# Patient Record
Sex: Female | Born: 1965 | Race: White | Hispanic: No | Marital: Married | Smoking: Former smoker
Health system: Southern US, Community
[De-identification: ages and names within clinical notes are randomized; demographics above are authoritative.]

## PROBLEM LIST (undated history)

## (undated) DIAGNOSIS — Z87442 Personal history of urinary calculi: Secondary | ICD-10-CM

## (undated) DIAGNOSIS — M1990 Unspecified osteoarthritis, unspecified site: Secondary | ICD-10-CM

## (undated) DIAGNOSIS — F419 Anxiety disorder, unspecified: Secondary | ICD-10-CM

## (undated) HISTORY — PX: MASTOPEXY: SHX5358

## (undated) HISTORY — PX: WISDOM TOOTH EXTRACTION: SHX21

## (undated) HISTORY — PX: ABDOMINOPLASTY: SHX5355

---

## 2007-12-11 ENCOUNTER — Encounter: Admission: RE | Admit: 2007-12-11 | Discharge: 2007-12-11 | Payer: Self-pay | Admitting: Obstetrics and Gynecology

## 2007-12-23 ENCOUNTER — Encounter (INDEPENDENT_AMBULATORY_CARE_PROVIDER_SITE_OTHER): Payer: Self-pay | Admitting: Diagnostic Radiology

## 2007-12-23 ENCOUNTER — Encounter: Admission: RE | Admit: 2007-12-23 | Discharge: 2007-12-23 | Payer: Self-pay | Admitting: Family Medicine

## 2009-09-14 ENCOUNTER — Encounter: Admission: RE | Admit: 2009-09-14 | Discharge: 2009-09-14 | Payer: Self-pay | Admitting: Obstetrics and Gynecology

## 2009-12-21 IMAGING — MG MM DIAGNOSTIC UNILATERAL L
3 series · 3 of 3 positions shown · non-contrast
Comparison: 12/04/2007

CLINICAL DATA: Left breast microcalcifications on screening study

DIGITAL DIAGNOSTIC  LEFT MAMMOGRAM ]

[L CC]
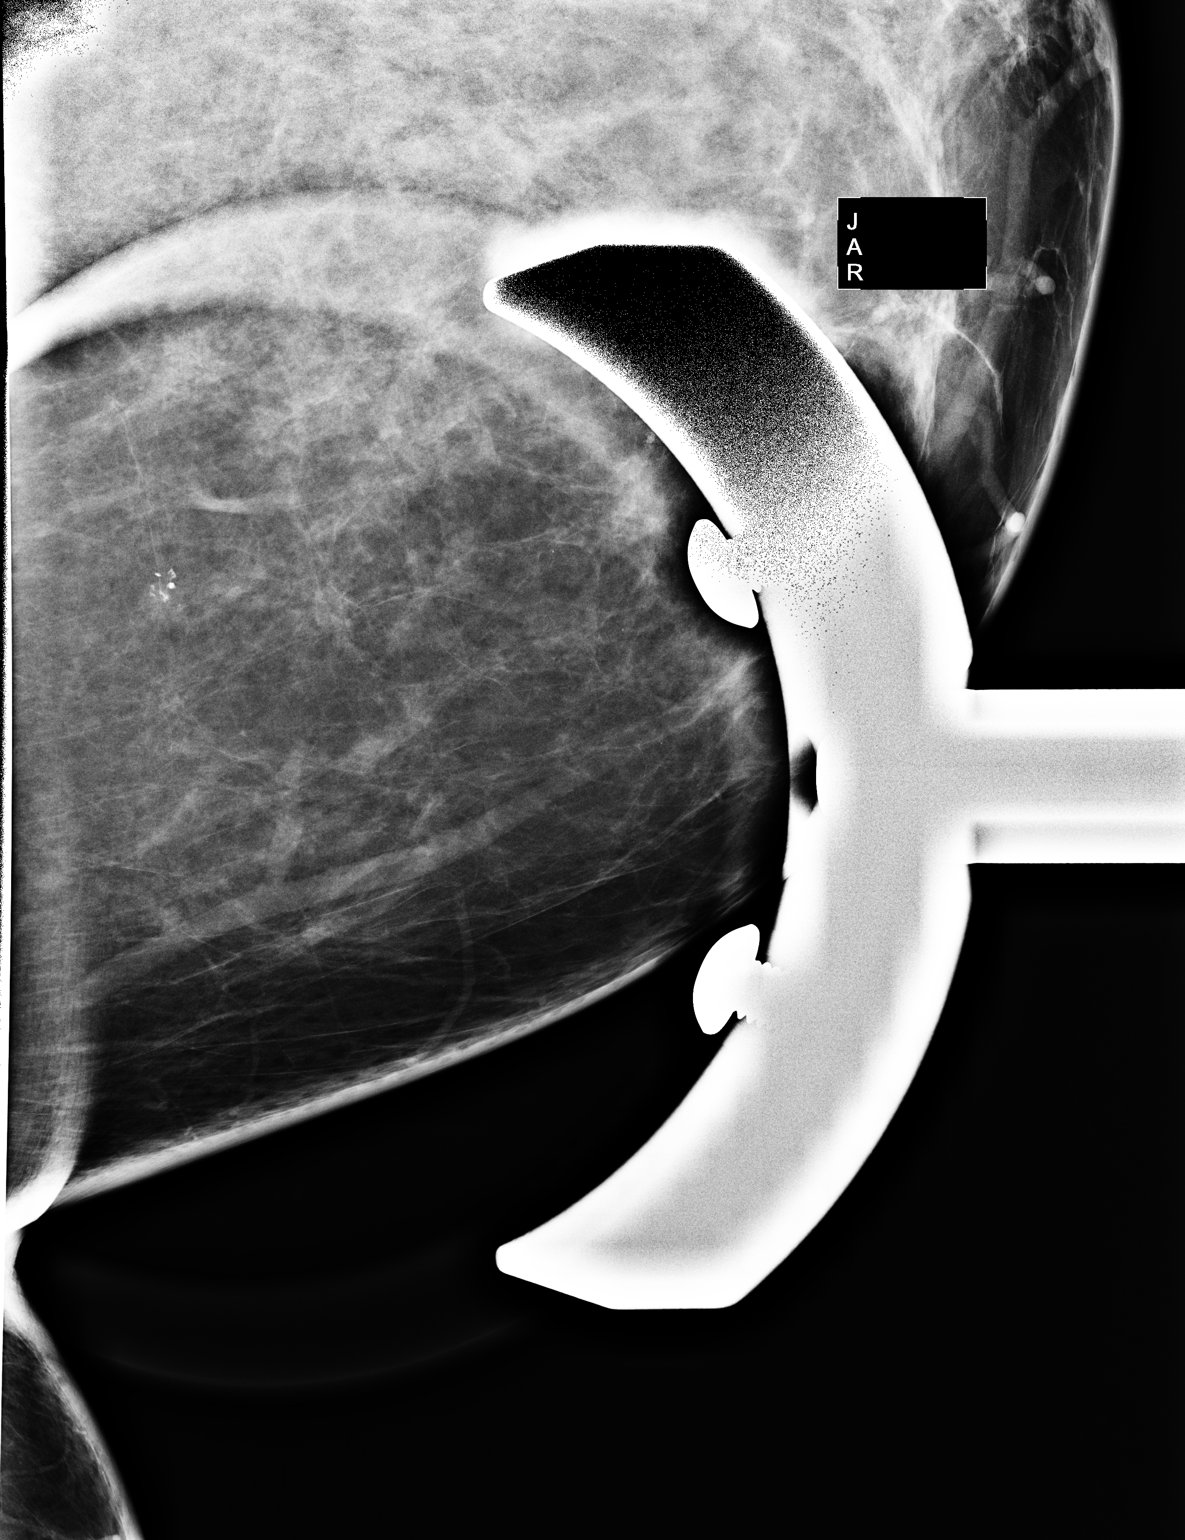

[L ML (1 of 2)]
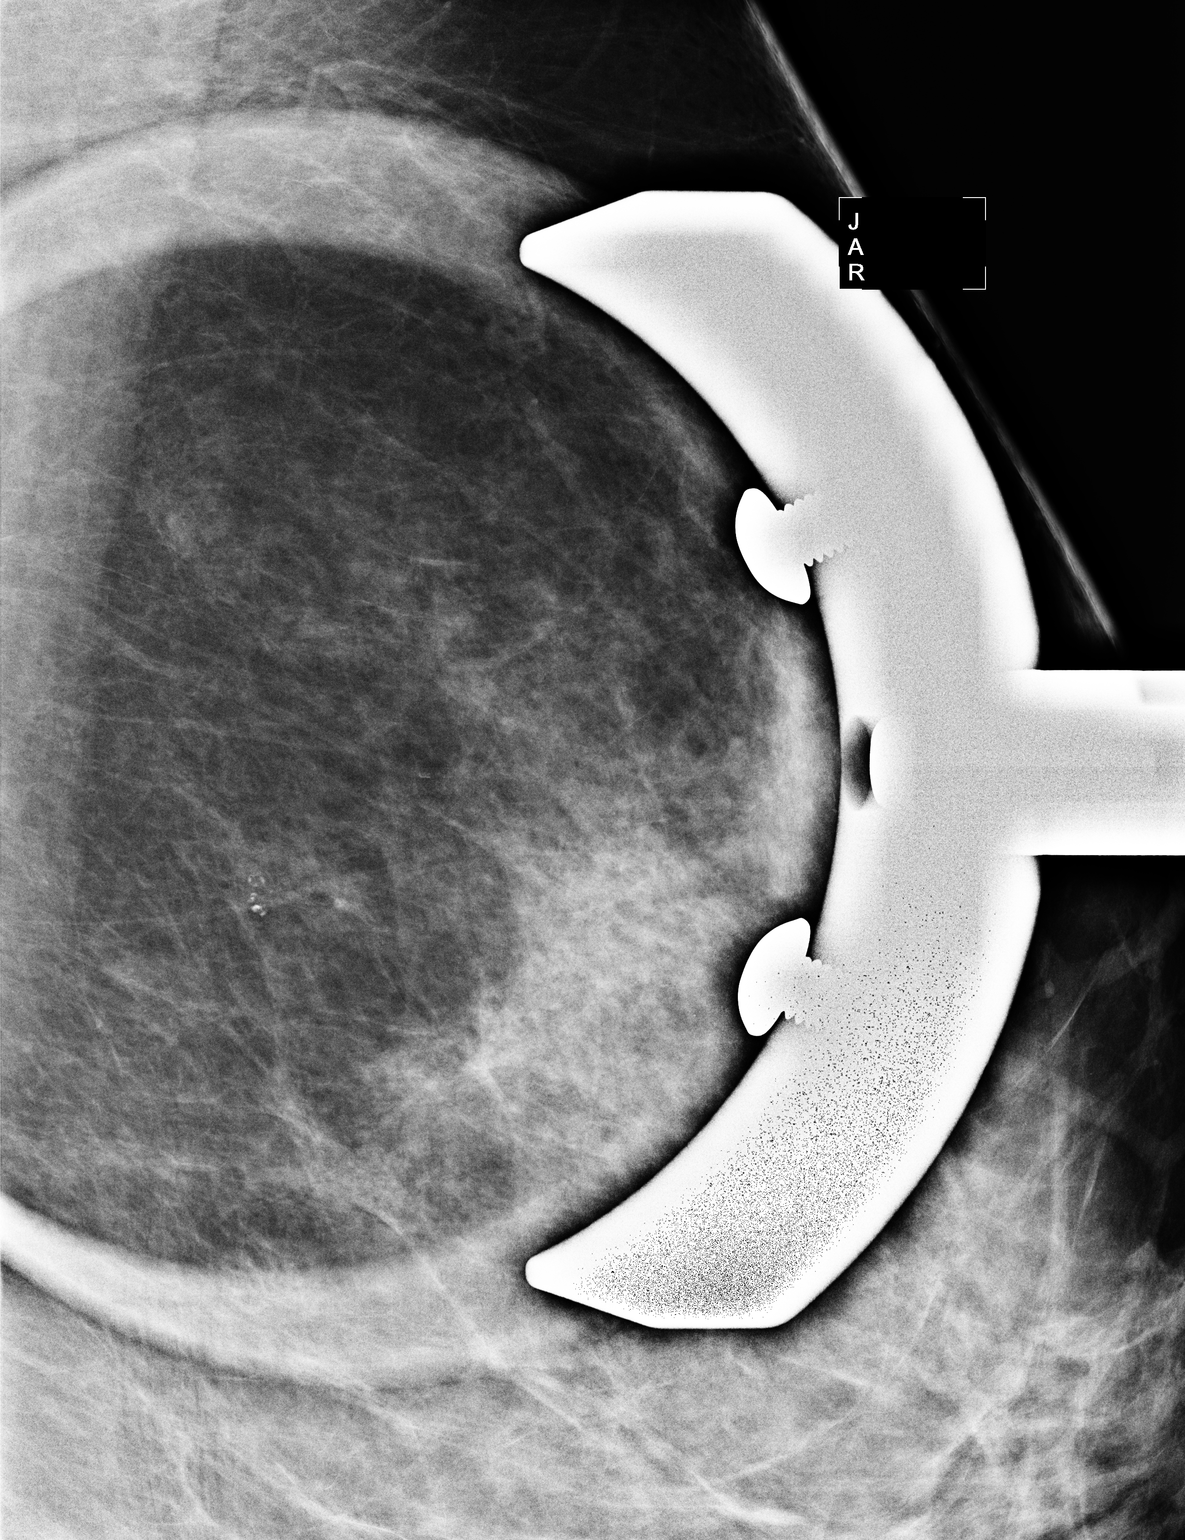

[L ML (2 of 2)]
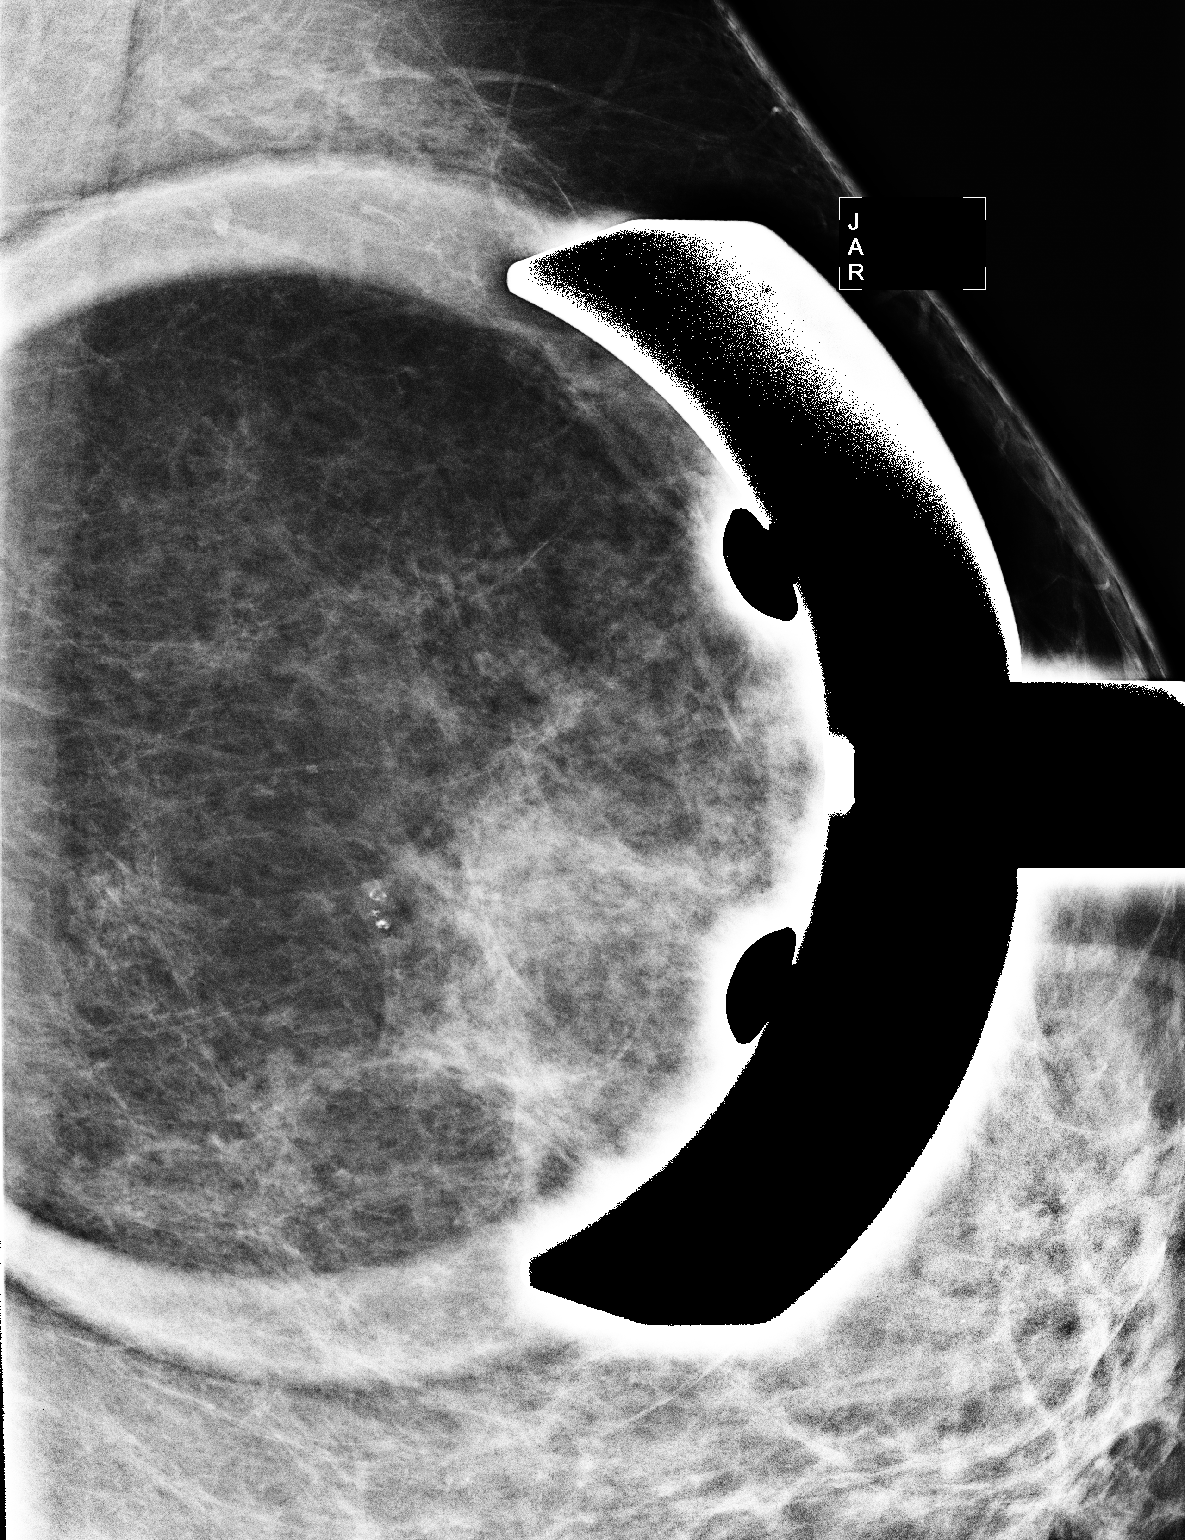

[3 of 3 positions shown; findings below may reference images not displayed]

FINDINGS: Magnification views show clustered microcalcifications
in the posterior inner upper quadrant.  These appear to be
dystrophic.
IMPRESSION: Probably dystrophic microcalcifications.

BI-RADS CATEGORY 3:  Probably benign finding(s) - short interval
follow-up suggested.

Recommendation:

Diagnostic left mammogram with magnification views in 6 months

## 2010-04-23 ENCOUNTER — Encounter: Payer: Self-pay | Admitting: Obstetrics and Gynecology

## 2011-01-26 ENCOUNTER — Other Ambulatory Visit: Payer: Self-pay | Admitting: Obstetrics and Gynecology

## 2011-01-26 DIAGNOSIS — R928 Other abnormal and inconclusive findings on diagnostic imaging of breast: Secondary | ICD-10-CM

## 2011-01-30 ENCOUNTER — Other Ambulatory Visit: Payer: Self-pay | Admitting: Obstetrics and Gynecology

## 2011-01-30 DIAGNOSIS — R928 Other abnormal and inconclusive findings on diagnostic imaging of breast: Secondary | ICD-10-CM

## 2011-02-15 ENCOUNTER — Ambulatory Visit
Admission: RE | Admit: 2011-02-15 | Discharge: 2011-02-15 | Disposition: A | Discharge status: Home / Self Care | Payer: Commercial Indemnity | Source: Ambulatory Visit | Attending: Obstetrics and Gynecology | Admitting: Obstetrics and Gynecology

## 2011-02-15 DIAGNOSIS — R928 Other abnormal and inconclusive findings on diagnostic imaging of breast: Secondary | ICD-10-CM

## 2011-07-17 ENCOUNTER — Other Ambulatory Visit: Payer: Self-pay | Admitting: Obstetrics and Gynecology

## 2011-07-17 DIAGNOSIS — N63 Unspecified lump in unspecified breast: Secondary | ICD-10-CM

## 2011-08-23 ENCOUNTER — Other Ambulatory Visit: Payer: Commercial Indemnity

## 2011-09-18 ENCOUNTER — Ambulatory Visit
Admission: RE | Admit: 2011-09-18 | Discharge: 2011-09-18 | Disposition: A | Discharge status: Home / Self Care | Payer: Commercial Indemnity | Source: Ambulatory Visit | Attending: Obstetrics and Gynecology | Admitting: Obstetrics and Gynecology

## 2011-09-18 DIAGNOSIS — N63 Unspecified lump in unspecified breast: Secondary | ICD-10-CM

## 2012-01-01 ENCOUNTER — Other Ambulatory Visit: Payer: Self-pay | Admitting: Obstetrics and Gynecology

## 2012-01-01 DIAGNOSIS — Z1231 Encounter for screening mammogram for malignant neoplasm of breast: Secondary | ICD-10-CM

## 2012-01-01 DIAGNOSIS — Z803 Family history of malignant neoplasm of breast: Secondary | ICD-10-CM

## 2012-01-29 ENCOUNTER — Ambulatory Visit: Payer: Commercial Indemnity

## 2012-02-22 ENCOUNTER — Other Ambulatory Visit: Payer: Self-pay | Admitting: Obstetrics and Gynecology

## 2012-02-22 ENCOUNTER — Ambulatory Visit
Admission: RE | Admit: 2012-02-22 | Discharge: 2012-02-22 | Disposition: A | Discharge status: Home / Self Care | Payer: Commercial Indemnity | Source: Ambulatory Visit | Attending: Obstetrics and Gynecology | Admitting: Obstetrics and Gynecology

## 2012-02-22 DIAGNOSIS — Z803 Family history of malignant neoplasm of breast: Secondary | ICD-10-CM

## 2012-02-22 DIAGNOSIS — Z1231 Encounter for screening mammogram for malignant neoplasm of breast: Secondary | ICD-10-CM

## 2013-12-25 ENCOUNTER — Other Ambulatory Visit: Payer: Self-pay | Admitting: Obstetrics and Gynecology

## 2013-12-28 LAB — CYTOLOGY - PAP

## 2013-12-29 ENCOUNTER — Ambulatory Visit (INDEPENDENT_AMBULATORY_CARE_PROVIDER_SITE_OTHER): Payer: Managed Care, Other (non HMO) | Admitting: Nurse Practitioner

## 2013-12-29 ENCOUNTER — Encounter: Payer: Self-pay | Admitting: Nurse Practitioner

## 2013-12-29 VITALS — BP 110/74 | HR 64 | Ht 63.0 in | Wt 149.0 lb

## 2013-12-29 DIAGNOSIS — R3915 Urgency of urination: Secondary | ICD-10-CM

## 2013-12-29 DIAGNOSIS — N951 Menopausal and female climacteric states: Secondary | ICD-10-CM

## 2013-12-29 LAB — POCT URINALYSIS DIPSTICK
BILIRUBIN UA: NEGATIVE
Glucose, UA: NEGATIVE
KETONES UA: NEGATIVE
LEUKOCYTES UA: NEGATIVE
NITRITE UA: NEGATIVE
Protein, UA: NEGATIVE
RBC UA: NEGATIVE
UROBILINOGEN UA: NEGATIVE

## 2013-12-29 NOTE — Progress Notes (Signed)
Patient ID: Bianca Sanders, female   DOB: [birth year], 48 y.o.   MRN: [id number] 48 y.o. G2P0 Married Caucasian Fe here for [facility] consult.  She is perimenopausal and now having vaso symptoms.  She is having hot flashes, poor sleep with insomnia, 'brain fog' and emotional symptoms.  She went last Friday and saw GYN Dr. Matthew Saras.  She states with the Paraguard IUD her menses have been somewhat  irregular this past year.  Cycles last 4 days with increase in PMS and headaches.  A friend at work gave her a few hormones - she had 1/2 tablet of Estradiol 0.5 mg and OTC Prometrium cream X 2 weeks.  Her vaso symptoms improved.  When she saw Dr. Matthew Saras he gave her Divigel and Prometrium which she has started.  She comes in now for another second opinion about HRT.   She did have some UTI symptoms yesterday with urgency - that has now resolved.  She wants to check a urine specimen.  Patient's last menstrual period was 12/15/2013.          Sexually active: Yes.    The current method of family planning is IUD. Copper 2 years   Exercising: No.  The patient does not participate in regular exercise at present. Smoker:  Occasionally when anxious  Health Maintenance: Pap:  12/25/13, at [facility] MMG:  12/25/13, 3D at [facility] Colonoscopy:  never TDaP:  30+ years ago Labs: at [facility] 12/25/13   reports that she has been smoking.  She has never used smokeless tobacco. She reports that she drinks about 2 ounces of alcohol per week. She reports that she does not use illicit drugs.  History reviewed. No pertinent past medical history.  Past Surgical History  Procedure Laterality Date  . Wisdom tooth extraction    . Mastopexy    . Abdominoplasty      Current Outpatient Prescriptions  Medication Sig Dispense Refill  . Estradiol (DIVIGEL) 1 MG/GM GEL Place onto the skin every morning.      . progesterone (PROMETRIUM) 100 MG capsule Take 1 capsule by mouth daily.      Marland Kitchen  zolpidem (AMBIEN) 5 MG tablet Take 1 tablet by mouth at bedtime.       No current facility-administered medications for this visit.    Family History  Problem Relation Age of Onset  . Hypertension Mother   . Thyroid disease Mother     hypothyroid  . Atrial fibrillation Mother   . Heart failure Father     CHF  . Diabetes Father   . Cancer Father     bone  . Breast cancer Sister 79    estrogen fed  . Hypertension Brother   . Thyroid disease Maternal Grandfather   . Diabetes Paternal Grandfather   . Hypertension Brother     ROS:  Pertinent items are noted in HPI.  Otherwise, a comprehensive ROS was negative.  Exam:   BP 110/74  Pulse 64  Ht _0  (1.6 m)  Wt 149 lb (67.586 kg)  BMI 26.40 kg/m2  LMP 12/15/2013 Height: _1  (160 cm)  Ht Readings from Last 3 Encounters:  12/29/13 _2  (1.6 m)    General appearance: alert, cooperative and appears stated age No exam done   Pelvic:not done  A:  Perimenopausal with vaso symptoms  Sees GYN - Dr. Matthew Saras and on HRT  Sister with history of breast cancer at age 84  Urinary urgency that is resolved  P:   Discussed WHI study with +/- benefits and risk of HRT  Questions were answered and she will continue to follow with Dr. Matthew Saras  Discussion about sister with breast cancer and BRCA testing  Urine chemstrip today was normal   Consult visit with patient and evaluation of urine was 42 minutes, 35 minutes face to face

## 2013-12-29 NOTE — Patient Instructions (Signed)
Check with sister about BRCA testing.  [place] for genetic testing

## 2013-12-29 NOTE — Progress Notes (Signed)
Encounter reviewed by Dr. Conley SimmondsBrook Silva. Patient is smoker and not candidate for combined oral contraceptives.

## 2014-01-08 ENCOUNTER — Telehealth: Payer: Self-pay | Admitting: *Deleted

## 2014-01-08 NOTE — Telephone Encounter (Signed)
Pt left a message requesting a genetic appt.  Called pt back and left her a message for call me so I can schedule it w/ her.

## 2014-01-11 ENCOUNTER — Telehealth: Payer: Self-pay | Admitting: *Deleted

## 2014-01-11 NOTE — Telephone Encounter (Signed)
Called and left a message for the pt to return my call so I can schedule a genetic appt.  

## 2014-01-21 ENCOUNTER — Telehealth: Payer: Self-pay | Admitting: *Deleted

## 2014-01-21 NOTE — Telephone Encounter (Signed)
Called and left a message for the pt to return my call so I can schedule a genetic appt.  

## 2014-01-27 ENCOUNTER — Telehealth: Payer: Self-pay | Admitting: Genetic Counselor

## 2014-01-27 NOTE — Telephone Encounter (Signed)
S/W PATIENT AND GAVE GENETIC APPT FOR 12/14 @ 2 Bianca ClontsW/KAREN Lowell GuitarPOWELL

## 2014-02-01 ENCOUNTER — Encounter: Payer: Self-pay | Admitting: Nurse Practitioner

## 2014-03-15 ENCOUNTER — Other Ambulatory Visit: Payer: Commercial Indemnity

## 2014-03-15 ENCOUNTER — Encounter: Payer: Self-pay | Admitting: Genetic Counselor

## 2014-03-15 ENCOUNTER — Ambulatory Visit (HOSPITAL_BASED_OUTPATIENT_CLINIC_OR_DEPARTMENT_OTHER): Payer: Commercial Indemnity | Admitting: Genetic Counselor

## 2014-03-15 DIAGNOSIS — Z803 Family history of malignant neoplasm of breast: Secondary | ICD-10-CM

## 2014-03-15 DIAGNOSIS — Z315 Encounter for genetic counseling: Secondary | ICD-10-CM

## 2014-03-15 NOTE — Progress Notes (Signed)
REFERRING PROVIDER: Milford Cage, NP  PRIMARY PROVIDER:  No PCP Per Patient  PRIMARY REASON FOR VISIT:  1. Family history of breast cancer in first degree relative      HISTORY OF PRESENT ILLNESS:   Ms. Arambula, a 48 y.o. female, was seen for a [facility] cancer genetics consultation at the request of Milford Cage, NP due to a family history of cancer.  Ms. Ryback presents to clinic today to discuss the possibility of a hereditary predisposition to cancer, genetic testing, and to further clarify her future cancer risks, as well as potential cancer risks for family members.   CANCER HISTORY:   No history exists.     HISTORY OF PRESENT ILLNESS: Ms. Shaikh is a 48 y.o. female with no personal history of cancer.  Her sister was diagnosed with breast cancer about 10 years ago, at age 44.  Ms. Tift is not aware that her sister has had genetic testing.  HORMONAL RISK FACTORS:  Ovaries intact: yes.  Hysterectomy: no.  Menopausal status: perimenopausal.  HRT use: 1 years. Up to date with pelvic exams:  yes.  History reviewed. No pertinent past medical history.  Past Surgical History  Procedure Laterality Date  . Wisdom tooth extraction    . Mastopexy    . Abdominoplasty      History   Social History  . Marital Status: Married    Spouse Name: N/A    Number of Children: 2  . Years of Education: N/A   Social History Main Topics  . Smoking status: Current Some Day Smoker  . Smokeless tobacco: Never Used  . Alcohol Use: 2.0 oz/week    4 drink(s) per week  . Drug Use: No  . Sexual Activity:    Partners: Male    Birth Control/ Protection: IUD   Other Topics Concern  . None   Social History Narrative     FAMILY HISTORY:  We obtained a detailed, 4-generation family history.  Significant diagnoses are listed below: Family History  Problem Relation Age of Onset  . Hypertension Mother   . Thyroid disease Mother     hypothyroid  .  Atrial fibrillation Mother   . Heart failure Father     CHF  . Diabetes Father   . Cancer Father     bone  . Breast cancer Sister 68    estrogen fed  . Hypertension Brother   . Thyroid disease Maternal Grandfather   . Diabetes Paternal Grandfather   . Hypertension Brother     Patient's maternal ancestors are of [place] descent, and paternal ancestors are of English descent. There is no reported Ashkenazi Jewish ancestry. There is no known consanguinity.  GENETIC COUNSELING ASSESSMENT: Jhane Lorio is a 48 y.o. female with a family history of premenopausal breast cancer which somewhat suggestive of a hereditary cancer syndrome and predisposition to cancer. We, therefore, discussed and recommended the following at today's visit.   DISCUSSION: We reviewed the characteristics, features and inheritance patterns of hereditary cancer syndromes. We also discussed genetic testing, including the appropriate family members to test, the process of testing, insurance coverage and turn-around-time for results. Based on the early onset breast cancer in Ms. Fruchtnight's sister, we discussed BRCA mutations, as well as other hereditary genes associated with breast cancer.  We discussed the implications of a negative, positive and/or variant of uncertain significant result. Based on Ms. Crites's family history she qualifies for genetic testing, however, her sister is the best person in the family to  test.  There is not a strong family history of cancer, despite having an individual with premenopausal breast cancer.   In order to estimate her chance of having a BRCA mutation, we used statistical models (Tyrer Cusik, Myriad risk calculator and [employer] II) and laboratory data that take into account her personal medical history, family history and ancestry.  Because each model is different, there can be a lot of variability in the risks they give.  Therefore, these numbers must be considered a rough range and  not a precise risk of having a BRCA mutation.  These models estimate that she has approximately a 0.77-6% chance of having a mutation. Based on the assessment of her family and personal history, genetic testing is recommended.  Based on the patient's personal and family history, statistical models (Tyrer Cusik)  and literature data were used to estimate her risk of developing breast cancer. These estimate her lifetime risk of developing breast cancer to be approximately 17.8%. This estimation does not take into account any genetic testing results.  The patient's lifetime breast cancer risk is a preliminary estimate based on available information using one of several models endorsed by the [place] (ACS). The ACS recommends consideration of breast MRI screening as an adjunct to mammography for patients at high risk (defined as 20% or greater lifetime risk). A more detailed breast cancer risk assessment can be considered, if clinically indicated.   PLAN: Based on Ms. Huneke's family history, we recommended her sister, Zigmund Daniel, who was diagnosed with breast cancer at age 24, have genetic counseling and testing. Ms. Willetts will let us know if we can be of any assistance in coordinating genetic counseling and/or testing for this family member.   Lastly, we encouraged Ms. Bougher to remain in contact with cancer genetics annually so that we can continuously update the family history and inform her of any changes in cancer genetics and testing that may be of benefit for this family.   Ms.  Favela's questions were answered to her satisfaction today. Our contact information was provided should additional questions or concerns arise. Thank you for the referral and allowing [place] to share in the care of your patient.   Tyreese Thain P. Florene Glen, [place], [facility] Certified Genetic Counselor [email or web address] phone: [phone]  The patient was seen for a total of 45 minutes in face-to-face  genetic counseling.  This patient was discussed with Drs. Magrinat, Lindi Adie and/or Burr Medico who agrees with the above.    _______________________________________________________________________ For Office Staff:  Number of people involved in session: 1 Was an Intern/ student involved with case: no

## 2014-06-01 ENCOUNTER — Encounter: Payer: Self-pay | Admitting: *Deleted

## 2014-06-02 ENCOUNTER — Ambulatory Visit (INDEPENDENT_AMBULATORY_CARE_PROVIDER_SITE_OTHER): Payer: Managed Care, Other (non HMO) | Admitting: *Deleted

## 2014-06-02 DIAGNOSIS — I8393 Asymptomatic varicose veins of bilateral lower extremities: Secondary | ICD-10-CM

## 2014-06-02 NOTE — Progress Notes (Signed)
X=.3% Sotradecol administered with a 27g butterfly.  Patient received a total of 5cc.  Cutaneous Laser:pulsed mode  810j/cm2 400 ms delay  13 ms Duration 0.5 spot  Total pulses: 905 Total energy 1.436  Total time::11  Treated all areas of concern with a combo of sclero and CL. Tol well. Anticipate good results and will follow prn.  Photos: Yes.    Compression stockings applied: Yes.

## 2014-06-03 ENCOUNTER — Encounter: Payer: Self-pay | Admitting: Vascular Surgery

## 2015-09-21 DIAGNOSIS — F988 Other specified behavioral and emotional disorders with onset usually occurring in childhood and adolescence: Secondary | ICD-10-CM | POA: Diagnosis not present

## 2015-09-21 DIAGNOSIS — N951 Menopausal and female climacteric states: Secondary | ICD-10-CM | POA: Diagnosis not present

## 2015-09-21 DIAGNOSIS — F419 Anxiety disorder, unspecified: Secondary | ICD-10-CM | POA: Diagnosis not present

## 2015-09-21 DIAGNOSIS — E663 Overweight: Secondary | ICD-10-CM | POA: Diagnosis not present

## 2015-11-15 DIAGNOSIS — L245 Irritant contact dermatitis due to other chemical products: Secondary | ICD-10-CM | POA: Diagnosis not present

## 2015-11-15 DIAGNOSIS — L718 Other rosacea: Secondary | ICD-10-CM | POA: Diagnosis not present

## 2015-12-22 DIAGNOSIS — Z6825 Body mass index (BMI) 25.0-25.9, adult: Secondary | ICD-10-CM | POA: Diagnosis not present

## 2015-12-22 DIAGNOSIS — E7889 Other lipoprotein metabolism disorders: Secondary | ICD-10-CM | POA: Diagnosis not present

## 2015-12-22 DIAGNOSIS — F988 Other specified behavioral and emotional disorders with onset usually occurring in childhood and adolescence: Secondary | ICD-10-CM | POA: Diagnosis not present

## 2015-12-22 DIAGNOSIS — F419 Anxiety disorder, unspecified: Secondary | ICD-10-CM | POA: Diagnosis not present

## 2016-03-15 DIAGNOSIS — Z6825 Body mass index (BMI) 25.0-25.9, adult: Secondary | ICD-10-CM | POA: Diagnosis not present

## 2016-03-15 DIAGNOSIS — Z13228 Encounter for screening for other metabolic disorders: Secondary | ICD-10-CM | POA: Diagnosis not present

## 2016-03-15 DIAGNOSIS — Z13220 Encounter for screening for lipoid disorders: Secondary | ICD-10-CM | POA: Diagnosis not present

## 2016-03-15 DIAGNOSIS — Z01419 Encounter for gynecological examination (general) (routine) without abnormal findings: Secondary | ICD-10-CM | POA: Diagnosis not present

## 2016-04-03 DIAGNOSIS — Z1231 Encounter for screening mammogram for malignant neoplasm of breast: Secondary | ICD-10-CM | POA: Diagnosis not present

## 2016-04-12 DIAGNOSIS — J329 Chronic sinusitis, unspecified: Secondary | ICD-10-CM | POA: Diagnosis not present

## 2016-04-12 DIAGNOSIS — E663 Overweight: Secondary | ICD-10-CM | POA: Diagnosis not present

## 2016-04-12 DIAGNOSIS — Z6826 Body mass index (BMI) 26.0-26.9, adult: Secondary | ICD-10-CM | POA: Diagnosis not present

## 2016-05-01 DIAGNOSIS — E663 Overweight: Secondary | ICD-10-CM | POA: Diagnosis not present

## 2016-05-01 DIAGNOSIS — Z716 Tobacco abuse counseling: Secondary | ICD-10-CM | POA: Diagnosis not present

## 2016-05-01 DIAGNOSIS — F988 Other specified behavioral and emotional disorders with onset usually occurring in childhood and adolescence: Secondary | ICD-10-CM | POA: Diagnosis not present

## 2016-05-01 DIAGNOSIS — Z6826 Body mass index (BMI) 26.0-26.9, adult: Secondary | ICD-10-CM | POA: Diagnosis not present

## 2016-10-29 DIAGNOSIS — F17200 Nicotine dependence, unspecified, uncomplicated: Secondary | ICD-10-CM | POA: Diagnosis not present

## 2016-10-29 DIAGNOSIS — Z6827 Body mass index (BMI) 27.0-27.9, adult: Secondary | ICD-10-CM | POA: Diagnosis not present

## 2016-10-29 DIAGNOSIS — F988 Other specified behavioral and emotional disorders with onset usually occurring in childhood and adolescence: Secondary | ICD-10-CM | POA: Diagnosis not present

## 2016-10-29 DIAGNOSIS — Z1389 Encounter for screening for other disorder: Secondary | ICD-10-CM | POA: Diagnosis not present

## 2016-10-29 DIAGNOSIS — F419 Anxiety disorder, unspecified: Secondary | ICD-10-CM | POA: Diagnosis not present

## 2016-12-11 DIAGNOSIS — F419 Anxiety disorder, unspecified: Secondary | ICD-10-CM | POA: Diagnosis not present

## 2016-12-11 DIAGNOSIS — F17200 Nicotine dependence, unspecified, uncomplicated: Secondary | ICD-10-CM | POA: Diagnosis not present

## 2016-12-11 DIAGNOSIS — Z6827 Body mass index (BMI) 27.0-27.9, adult: Secondary | ICD-10-CM | POA: Diagnosis not present

## 2016-12-11 DIAGNOSIS — F988 Other specified behavioral and emotional disorders with onset usually occurring in childhood and adolescence: Secondary | ICD-10-CM | POA: Diagnosis not present

## 2016-12-27 DIAGNOSIS — L738 Other specified follicular disorders: Secondary | ICD-10-CM | POA: Diagnosis not present

## 2016-12-27 DIAGNOSIS — L820 Inflamed seborrheic keratosis: Secondary | ICD-10-CM | POA: Diagnosis not present

## 2017-02-25 DIAGNOSIS — L819 Disorder of pigmentation, unspecified: Secondary | ICD-10-CM | POA: Diagnosis not present

## 2017-02-25 DIAGNOSIS — L821 Other seborrheic keratosis: Secondary | ICD-10-CM | POA: Diagnosis not present

## 2017-02-25 DIAGNOSIS — D1801 Hemangioma of skin and subcutaneous tissue: Secondary | ICD-10-CM | POA: Diagnosis not present

## 2017-03-15 DIAGNOSIS — F419 Anxiety disorder, unspecified: Secondary | ICD-10-CM | POA: Diagnosis not present

## 2017-03-15 DIAGNOSIS — M25552 Pain in left hip: Secondary | ICD-10-CM | POA: Diagnosis not present

## 2017-03-15 DIAGNOSIS — F17200 Nicotine dependence, unspecified, uncomplicated: Secondary | ICD-10-CM | POA: Diagnosis not present

## 2017-03-15 DIAGNOSIS — F988 Other specified behavioral and emotional disorders with onset usually occurring in childhood and adolescence: Secondary | ICD-10-CM | POA: Diagnosis not present

## 2017-04-10 DIAGNOSIS — Z1231 Encounter for screening mammogram for malignant neoplasm of breast: Secondary | ICD-10-CM | POA: Diagnosis not present

## 2017-04-10 DIAGNOSIS — Z01419 Encounter for gynecological examination (general) (routine) without abnormal findings: Secondary | ICD-10-CM | POA: Diagnosis not present

## 2017-04-10 DIAGNOSIS — Z6825 Body mass index (BMI) 25.0-25.9, adult: Secondary | ICD-10-CM | POA: Diagnosis not present

## 2017-06-24 DIAGNOSIS — F419 Anxiety disorder, unspecified: Secondary | ICD-10-CM | POA: Diagnosis not present

## 2017-06-24 DIAGNOSIS — E7889 Other lipoprotein metabolism disorders: Secondary | ICD-10-CM | POA: Diagnosis not present

## 2017-06-24 DIAGNOSIS — F988 Other specified behavioral and emotional disorders with onset usually occurring in childhood and adolescence: Secondary | ICD-10-CM | POA: Diagnosis not present

## 2017-06-24 DIAGNOSIS — Z1211 Encounter for screening for malignant neoplasm of colon: Secondary | ICD-10-CM | POA: Diagnosis not present

## 2017-09-16 DIAGNOSIS — F988 Other specified behavioral and emotional disorders with onset usually occurring in childhood and adolescence: Secondary | ICD-10-CM | POA: Diagnosis not present

## 2017-09-16 DIAGNOSIS — Z79899 Other long term (current) drug therapy: Secondary | ICD-10-CM | POA: Diagnosis not present

## 2017-09-16 DIAGNOSIS — F419 Anxiety disorder, unspecified: Secondary | ICD-10-CM | POA: Diagnosis not present

## 2017-09-16 DIAGNOSIS — E7889 Other lipoprotein metabolism disorders: Secondary | ICD-10-CM | POA: Diagnosis not present

## 2017-11-01 DIAGNOSIS — M545 Low back pain: Secondary | ICD-10-CM | POA: Diagnosis not present

## 2017-11-01 DIAGNOSIS — M1612 Unilateral primary osteoarthritis, left hip: Secondary | ICD-10-CM | POA: Diagnosis not present

## 2017-11-01 DIAGNOSIS — M25552 Pain in left hip: Secondary | ICD-10-CM | POA: Diagnosis not present

## 2017-11-08 DIAGNOSIS — Z13820 Encounter for screening for osteoporosis: Secondary | ICD-10-CM | POA: Diagnosis not present

## 2017-11-15 DIAGNOSIS — M25552 Pain in left hip: Secondary | ICD-10-CM | POA: Diagnosis not present

## 2017-11-22 DIAGNOSIS — M25552 Pain in left hip: Secondary | ICD-10-CM | POA: Diagnosis not present

## 2017-11-29 DIAGNOSIS — M25552 Pain in left hip: Secondary | ICD-10-CM | POA: Diagnosis not present

## 2017-12-13 DIAGNOSIS — M25552 Pain in left hip: Secondary | ICD-10-CM | POA: Diagnosis not present

## 2017-12-19 DIAGNOSIS — F419 Anxiety disorder, unspecified: Secondary | ICD-10-CM | POA: Diagnosis not present

## 2017-12-19 DIAGNOSIS — Z1331 Encounter for screening for depression: Secondary | ICD-10-CM | POA: Diagnosis not present

## 2017-12-19 DIAGNOSIS — Z6826 Body mass index (BMI) 26.0-26.9, adult: Secondary | ICD-10-CM | POA: Diagnosis not present

## 2017-12-19 DIAGNOSIS — M25552 Pain in left hip: Secondary | ICD-10-CM | POA: Diagnosis not present

## 2017-12-19 DIAGNOSIS — F988 Other specified behavioral and emotional disorders with onset usually occurring in childhood and adolescence: Secondary | ICD-10-CM | POA: Diagnosis not present

## 2017-12-20 DIAGNOSIS — M25552 Pain in left hip: Secondary | ICD-10-CM | POA: Diagnosis not present

## 2018-01-31 DIAGNOSIS — M25552 Pain in left hip: Secondary | ICD-10-CM | POA: Diagnosis not present

## 2018-02-25 DIAGNOSIS — D225 Melanocytic nevi of trunk: Secondary | ICD-10-CM | POA: Diagnosis not present

## 2018-02-25 DIAGNOSIS — L821 Other seborrheic keratosis: Secondary | ICD-10-CM | POA: Diagnosis not present

## 2018-03-28 DIAGNOSIS — Z1211 Encounter for screening for malignant neoplasm of colon: Secondary | ICD-10-CM | POA: Diagnosis not present

## 2018-03-28 DIAGNOSIS — Z6827 Body mass index (BMI) 27.0-27.9, adult: Secondary | ICD-10-CM | POA: Diagnosis not present

## 2018-03-28 DIAGNOSIS — F419 Anxiety disorder, unspecified: Secondary | ICD-10-CM | POA: Diagnosis not present

## 2018-03-28 DIAGNOSIS — F988 Other specified behavioral and emotional disorders with onset usually occurring in childhood and adolescence: Secondary | ICD-10-CM | POA: Diagnosis not present

## 2018-06-27 DIAGNOSIS — F988 Other specified behavioral and emotional disorders with onset usually occurring in childhood and adolescence: Secondary | ICD-10-CM | POA: Diagnosis not present

## 2018-06-27 DIAGNOSIS — F419 Anxiety disorder, unspecified: Secondary | ICD-10-CM | POA: Diagnosis not present

## 2018-06-27 DIAGNOSIS — Z6827 Body mass index (BMI) 27.0-27.9, adult: Secondary | ICD-10-CM | POA: Diagnosis not present

## 2018-07-03 DIAGNOSIS — M25552 Pain in left hip: Secondary | ICD-10-CM | POA: Diagnosis not present

## 2018-07-03 DIAGNOSIS — M1612 Unilateral primary osteoarthritis, left hip: Secondary | ICD-10-CM | POA: Diagnosis not present

## 2018-07-16 DIAGNOSIS — Z01419 Encounter for gynecological examination (general) (routine) without abnormal findings: Secondary | ICD-10-CM | POA: Diagnosis not present

## 2018-07-16 DIAGNOSIS — Z6827 Body mass index (BMI) 27.0-27.9, adult: Secondary | ICD-10-CM | POA: Diagnosis not present

## 2018-07-16 DIAGNOSIS — Z1231 Encounter for screening mammogram for malignant neoplasm of breast: Secondary | ICD-10-CM | POA: Diagnosis not present

## 2018-08-19 DIAGNOSIS — F988 Other specified behavioral and emotional disorders with onset usually occurring in childhood and adolescence: Secondary | ICD-10-CM | POA: Diagnosis not present

## 2018-08-19 DIAGNOSIS — Z6827 Body mass index (BMI) 27.0-27.9, adult: Secondary | ICD-10-CM | POA: Diagnosis not present

## 2018-08-19 DIAGNOSIS — Z0000 Encounter for general adult medical examination without abnormal findings: Secondary | ICD-10-CM | POA: Diagnosis not present

## 2018-08-19 DIAGNOSIS — M25552 Pain in left hip: Secondary | ICD-10-CM | POA: Diagnosis not present

## 2018-09-01 DIAGNOSIS — M1612 Unilateral primary osteoarthritis, left hip: Secondary | ICD-10-CM | POA: Diagnosis not present

## 2018-09-01 DIAGNOSIS — Z0189 Encounter for other specified special examinations: Secondary | ICD-10-CM | POA: Diagnosis not present

## 2018-09-02 DIAGNOSIS — M1612 Unilateral primary osteoarthritis, left hip: Secondary | ICD-10-CM | POA: Diagnosis not present

## 2018-10-07 DIAGNOSIS — Z471 Aftercare following joint replacement surgery: Secondary | ICD-10-CM | POA: Diagnosis not present

## 2018-10-07 DIAGNOSIS — Z96642 Presence of left artificial hip joint: Secondary | ICD-10-CM | POA: Diagnosis not present

## 2018-10-24 DIAGNOSIS — F988 Other specified behavioral and emotional disorders with onset usually occurring in childhood and adolescence: Secondary | ICD-10-CM | POA: Diagnosis not present

## 2018-10-24 DIAGNOSIS — F419 Anxiety disorder, unspecified: Secondary | ICD-10-CM | POA: Diagnosis not present

## 2018-10-24 DIAGNOSIS — Z6827 Body mass index (BMI) 27.0-27.9, adult: Secondary | ICD-10-CM | POA: Diagnosis not present

## 2019-01-23 DIAGNOSIS — Z6828 Body mass index (BMI) 28.0-28.9, adult: Secondary | ICD-10-CM | POA: Diagnosis not present

## 2019-01-23 DIAGNOSIS — F988 Other specified behavioral and emotional disorders with onset usually occurring in childhood and adolescence: Secondary | ICD-10-CM | POA: Diagnosis not present

## 2019-01-23 DIAGNOSIS — F419 Anxiety disorder, unspecified: Secondary | ICD-10-CM | POA: Diagnosis not present

## 2019-03-05 DIAGNOSIS — D2262 Melanocytic nevi of left upper limb, including shoulder: Secondary | ICD-10-CM | POA: Diagnosis not present

## 2019-03-05 DIAGNOSIS — L814 Other melanin hyperpigmentation: Secondary | ICD-10-CM | POA: Diagnosis not present

## 2019-03-05 DIAGNOSIS — D225 Melanocytic nevi of trunk: Secondary | ICD-10-CM | POA: Diagnosis not present

## 2019-03-05 DIAGNOSIS — D2261 Melanocytic nevi of right upper limb, including shoulder: Secondary | ICD-10-CM | POA: Diagnosis not present

## 2019-04-03 HISTORY — PX: OTHER SURGICAL HISTORY: SHX169

## 2019-04-10 ENCOUNTER — Other Ambulatory Visit: Payer: PRIVATE HEALTH INSURANCE

## 2019-04-13 ENCOUNTER — Other Ambulatory Visit: Payer: PRIVATE HEALTH INSURANCE

## 2019-04-13 ENCOUNTER — Ambulatory Visit: Payer: PRIVATE HEALTH INSURANCE | Attending: Internal Medicine

## 2019-04-13 DIAGNOSIS — Z20822 Contact with and (suspected) exposure to covid-19: Secondary | ICD-10-CM | POA: Diagnosis not present

## 2019-04-15 LAB — NOVEL CORONAVIRUS, NAA: SARS-CoV-2, NAA: NOT DETECTED

## 2019-04-17 DIAGNOSIS — F419 Anxiety disorder, unspecified: Secondary | ICD-10-CM | POA: Diagnosis not present

## 2019-04-17 DIAGNOSIS — Z6828 Body mass index (BMI) 28.0-28.9, adult: Secondary | ICD-10-CM | POA: Diagnosis not present

## 2019-04-17 DIAGNOSIS — F988 Other specified behavioral and emotional disorders with onset usually occurring in childhood and adolescence: Secondary | ICD-10-CM | POA: Diagnosis not present

## 2019-07-17 DIAGNOSIS — F419 Anxiety disorder, unspecified: Secondary | ICD-10-CM | POA: Diagnosis not present

## 2019-07-17 DIAGNOSIS — Z6827 Body mass index (BMI) 27.0-27.9, adult: Secondary | ICD-10-CM | POA: Diagnosis not present

## 2019-07-17 DIAGNOSIS — F988 Other specified behavioral and emotional disorders with onset usually occurring in childhood and adolescence: Secondary | ICD-10-CM | POA: Diagnosis not present

## 2019-08-10 DIAGNOSIS — D509 Iron deficiency anemia, unspecified: Secondary | ICD-10-CM | POA: Diagnosis not present

## 2019-08-28 DIAGNOSIS — Z471 Aftercare following joint replacement surgery: Secondary | ICD-10-CM | POA: Diagnosis not present

## 2019-08-28 DIAGNOSIS — Z96642 Presence of left artificial hip joint: Secondary | ICD-10-CM | POA: Diagnosis not present

## 2019-10-30 DIAGNOSIS — F988 Other specified behavioral and emotional disorders with onset usually occurring in childhood and adolescence: Secondary | ICD-10-CM | POA: Diagnosis not present

## 2019-10-30 DIAGNOSIS — Z6825 Body mass index (BMI) 25.0-25.9, adult: Secondary | ICD-10-CM | POA: Diagnosis not present

## 2019-10-30 DIAGNOSIS — E7841 Elevated Lipoprotein(a): Secondary | ICD-10-CM | POA: Diagnosis not present

## 2019-10-30 DIAGNOSIS — F419 Anxiety disorder, unspecified: Secondary | ICD-10-CM | POA: Diagnosis not present

## 2020-01-29 DIAGNOSIS — Z79899 Other long term (current) drug therapy: Secondary | ICD-10-CM | POA: Diagnosis not present

## 2020-01-29 DIAGNOSIS — F419 Anxiety disorder, unspecified: Secondary | ICD-10-CM | POA: Diagnosis not present

## 2020-01-29 DIAGNOSIS — F988 Other specified behavioral and emotional disorders with onset usually occurring in childhood and adolescence: Secondary | ICD-10-CM | POA: Diagnosis not present

## 2020-01-29 DIAGNOSIS — E7841 Elevated Lipoprotein(a): Secondary | ICD-10-CM | POA: Diagnosis not present

## 2020-03-02 DIAGNOSIS — Z01419 Encounter for gynecological examination (general) (routine) without abnormal findings: Secondary | ICD-10-CM | POA: Diagnosis not present

## 2020-03-02 DIAGNOSIS — Z1231 Encounter for screening mammogram for malignant neoplasm of breast: Secondary | ICD-10-CM | POA: Diagnosis not present

## 2020-03-02 DIAGNOSIS — Z13820 Encounter for screening for osteoporosis: Secondary | ICD-10-CM | POA: Diagnosis not present

## 2020-03-02 DIAGNOSIS — Z6828 Body mass index (BMI) 28.0-28.9, adult: Secondary | ICD-10-CM | POA: Diagnosis not present

## 2020-03-07 DIAGNOSIS — D225 Melanocytic nevi of trunk: Secondary | ICD-10-CM | POA: Diagnosis not present

## 2020-03-07 DIAGNOSIS — L819 Disorder of pigmentation, unspecified: Secondary | ICD-10-CM | POA: Diagnosis not present

## 2020-03-07 DIAGNOSIS — L821 Other seborrheic keratosis: Secondary | ICD-10-CM | POA: Diagnosis not present

## 2020-03-07 DIAGNOSIS — L570 Actinic keratosis: Secondary | ICD-10-CM | POA: Diagnosis not present

## 2020-03-07 DIAGNOSIS — L814 Other melanin hyperpigmentation: Secondary | ICD-10-CM | POA: Diagnosis not present

## 2020-04-06 DIAGNOSIS — U071 COVID-19: Secondary | ICD-10-CM | POA: Diagnosis not present

## 2020-04-06 DIAGNOSIS — J029 Acute pharyngitis, unspecified: Secondary | ICD-10-CM | POA: Diagnosis not present

## 2020-04-29 DIAGNOSIS — F900 Attention-deficit hyperactivity disorder, predominantly inattentive type: Secondary | ICD-10-CM | POA: Diagnosis not present

## 2020-04-29 DIAGNOSIS — F411 Generalized anxiety disorder: Secondary | ICD-10-CM | POA: Diagnosis not present

## 2020-05-27 DIAGNOSIS — F411 Generalized anxiety disorder: Secondary | ICD-10-CM | POA: Diagnosis not present

## 2020-05-27 DIAGNOSIS — F900 Attention-deficit hyperactivity disorder, predominantly inattentive type: Secondary | ICD-10-CM | POA: Diagnosis not present

## 2020-06-03 DIAGNOSIS — F411 Generalized anxiety disorder: Secondary | ICD-10-CM | POA: Diagnosis not present

## 2020-06-03 DIAGNOSIS — F900 Attention-deficit hyperactivity disorder, predominantly inattentive type: Secondary | ICD-10-CM | POA: Diagnosis not present

## 2020-06-24 DIAGNOSIS — F900 Attention-deficit hyperactivity disorder, predominantly inattentive type: Secondary | ICD-10-CM | POA: Diagnosis not present

## 2020-06-24 DIAGNOSIS — F411 Generalized anxiety disorder: Secondary | ICD-10-CM | POA: Diagnosis not present

## 2020-07-01 DIAGNOSIS — N951 Menopausal and female climacteric states: Secondary | ICD-10-CM | POA: Diagnosis not present

## 2020-07-01 DIAGNOSIS — I10 Essential (primary) hypertension: Secondary | ICD-10-CM | POA: Diagnosis not present

## 2020-07-01 DIAGNOSIS — F1020 Alcohol dependence, uncomplicated: Secondary | ICD-10-CM | POA: Diagnosis not present

## 2020-07-01 DIAGNOSIS — R635 Abnormal weight gain: Secondary | ICD-10-CM | POA: Diagnosis not present

## 2020-07-08 DIAGNOSIS — F419 Anxiety disorder, unspecified: Secondary | ICD-10-CM | POA: Diagnosis not present

## 2020-07-08 DIAGNOSIS — R6882 Decreased libido: Secondary | ICD-10-CM | POA: Diagnosis not present

## 2020-07-08 DIAGNOSIS — F900 Attention-deficit hyperactivity disorder, predominantly inattentive type: Secondary | ICD-10-CM | POA: Diagnosis not present

## 2020-07-08 DIAGNOSIS — N951 Menopausal and female climacteric states: Secondary | ICD-10-CM | POA: Diagnosis not present

## 2020-07-08 DIAGNOSIS — F411 Generalized anxiety disorder: Secondary | ICD-10-CM | POA: Diagnosis not present

## 2020-07-08 DIAGNOSIS — Z6829 Body mass index (BMI) 29.0-29.9, adult: Secondary | ICD-10-CM | POA: Diagnosis not present

## 2020-07-08 DIAGNOSIS — Z1339 Encounter for screening examination for other mental health and behavioral disorders: Secondary | ICD-10-CM | POA: Diagnosis not present

## 2020-07-08 DIAGNOSIS — Z1331 Encounter for screening for depression: Secondary | ICD-10-CM | POA: Diagnosis not present

## 2020-08-05 DIAGNOSIS — F900 Attention-deficit hyperactivity disorder, predominantly inattentive type: Secondary | ICD-10-CM | POA: Diagnosis not present

## 2020-08-05 DIAGNOSIS — N951 Menopausal and female climacteric states: Secondary | ICD-10-CM | POA: Diagnosis not present

## 2020-08-05 DIAGNOSIS — F411 Generalized anxiety disorder: Secondary | ICD-10-CM | POA: Diagnosis not present

## 2020-08-19 DIAGNOSIS — F411 Generalized anxiety disorder: Secondary | ICD-10-CM | POA: Diagnosis not present

## 2020-08-19 DIAGNOSIS — F900 Attention-deficit hyperactivity disorder, predominantly inattentive type: Secondary | ICD-10-CM | POA: Diagnosis not present

## 2020-09-09 DIAGNOSIS — F900 Attention-deficit hyperactivity disorder, predominantly inattentive type: Secondary | ICD-10-CM | POA: Diagnosis not present

## 2020-09-09 DIAGNOSIS — F411 Generalized anxiety disorder: Secondary | ICD-10-CM | POA: Diagnosis not present

## 2020-09-16 DIAGNOSIS — N898 Other specified noninflammatory disorders of vagina: Secondary | ICD-10-CM | POA: Diagnosis not present

## 2020-09-16 DIAGNOSIS — R6882 Decreased libido: Secondary | ICD-10-CM | POA: Diagnosis not present

## 2020-09-16 DIAGNOSIS — N951 Menopausal and female climacteric states: Secondary | ICD-10-CM | POA: Diagnosis not present

## 2020-09-16 DIAGNOSIS — Z6827 Body mass index (BMI) 27.0-27.9, adult: Secondary | ICD-10-CM | POA: Diagnosis not present

## 2020-10-07 DIAGNOSIS — F411 Generalized anxiety disorder: Secondary | ICD-10-CM | POA: Diagnosis not present

## 2020-10-07 DIAGNOSIS — N951 Menopausal and female climacteric states: Secondary | ICD-10-CM | POA: Diagnosis not present

## 2020-10-07 DIAGNOSIS — F900 Attention-deficit hyperactivity disorder, predominantly inattentive type: Secondary | ICD-10-CM | POA: Diagnosis not present

## 2020-10-11 DIAGNOSIS — Z6827 Body mass index (BMI) 27.0-27.9, adult: Secondary | ICD-10-CM | POA: Diagnosis not present

## 2020-10-11 DIAGNOSIS — N951 Menopausal and female climacteric states: Secondary | ICD-10-CM | POA: Diagnosis not present

## 2020-10-11 DIAGNOSIS — R6882 Decreased libido: Secondary | ICD-10-CM | POA: Diagnosis not present

## 2020-10-11 DIAGNOSIS — R232 Flushing: Secondary | ICD-10-CM | POA: Diagnosis not present

## 2020-11-04 DIAGNOSIS — F411 Generalized anxiety disorder: Secondary | ICD-10-CM | POA: Diagnosis not present

## 2020-11-04 DIAGNOSIS — F900 Attention-deficit hyperactivity disorder, predominantly inattentive type: Secondary | ICD-10-CM | POA: Diagnosis not present

## 2020-11-25 DIAGNOSIS — F900 Attention-deficit hyperactivity disorder, predominantly inattentive type: Secondary | ICD-10-CM | POA: Diagnosis not present

## 2020-11-25 DIAGNOSIS — F411 Generalized anxiety disorder: Secondary | ICD-10-CM | POA: Diagnosis not present

## 2020-12-16 DIAGNOSIS — F900 Attention-deficit hyperactivity disorder, predominantly inattentive type: Secondary | ICD-10-CM | POA: Diagnosis not present

## 2020-12-16 DIAGNOSIS — F411 Generalized anxiety disorder: Secondary | ICD-10-CM | POA: Diagnosis not present

## 2021-01-06 DIAGNOSIS — N951 Menopausal and female climacteric states: Secondary | ICD-10-CM | POA: Diagnosis not present

## 2021-01-06 DIAGNOSIS — F900 Attention-deficit hyperactivity disorder, predominantly inattentive type: Secondary | ICD-10-CM | POA: Diagnosis not present

## 2021-01-06 DIAGNOSIS — F411 Generalized anxiety disorder: Secondary | ICD-10-CM | POA: Diagnosis not present

## 2021-01-12 DIAGNOSIS — R110 Nausea: Secondary | ICD-10-CM | POA: Diagnosis not present

## 2021-01-12 DIAGNOSIS — Z1211 Encounter for screening for malignant neoplasm of colon: Secondary | ICD-10-CM | POA: Diagnosis not present

## 2021-01-13 DIAGNOSIS — F419 Anxiety disorder, unspecified: Secondary | ICD-10-CM | POA: Diagnosis not present

## 2021-01-13 DIAGNOSIS — Z6825 Body mass index (BMI) 25.0-25.9, adult: Secondary | ICD-10-CM | POA: Diagnosis not present

## 2021-01-13 DIAGNOSIS — N951 Menopausal and female climacteric states: Secondary | ICD-10-CM | POA: Diagnosis not present

## 2021-01-13 DIAGNOSIS — R232 Flushing: Secondary | ICD-10-CM | POA: Diagnosis not present

## 2021-01-19 DIAGNOSIS — Z1211 Encounter for screening for malignant neoplasm of colon: Secondary | ICD-10-CM | POA: Diagnosis not present

## 2021-02-03 DIAGNOSIS — F411 Generalized anxiety disorder: Secondary | ICD-10-CM | POA: Diagnosis not present

## 2021-02-03 DIAGNOSIS — F900 Attention-deficit hyperactivity disorder, predominantly inattentive type: Secondary | ICD-10-CM | POA: Diagnosis not present

## 2021-03-08 DIAGNOSIS — L821 Other seborrheic keratosis: Secondary | ICD-10-CM | POA: Diagnosis not present

## 2021-03-08 DIAGNOSIS — D225 Melanocytic nevi of trunk: Secondary | ICD-10-CM | POA: Diagnosis not present

## 2021-03-08 DIAGNOSIS — L720 Epidermal cyst: Secondary | ICD-10-CM | POA: Diagnosis not present

## 2021-03-17 DIAGNOSIS — D128 Benign neoplasm of rectum: Secondary | ICD-10-CM | POA: Diagnosis not present

## 2021-03-17 DIAGNOSIS — K621 Rectal polyp: Secondary | ICD-10-CM | POA: Diagnosis not present

## 2021-03-17 DIAGNOSIS — Z1211 Encounter for screening for malignant neoplasm of colon: Secondary | ICD-10-CM | POA: Diagnosis not present

## 2021-03-31 DIAGNOSIS — F900 Attention-deficit hyperactivity disorder, predominantly inattentive type: Secondary | ICD-10-CM | POA: Diagnosis not present

## 2021-03-31 DIAGNOSIS — F411 Generalized anxiety disorder: Secondary | ICD-10-CM | POA: Diagnosis not present

## 2022-09-30 ENCOUNTER — Encounter (HOSPITAL_BASED_OUTPATIENT_CLINIC_OR_DEPARTMENT_OTHER): Payer: Self-pay

## 2022-09-30 ENCOUNTER — Emergency Department (HOSPITAL_BASED_OUTPATIENT_CLINIC_OR_DEPARTMENT_OTHER): Payer: BC Managed Care – PPO

## 2022-09-30 ENCOUNTER — Inpatient Hospital Stay (HOSPITAL_BASED_OUTPATIENT_CLINIC_OR_DEPARTMENT_OTHER)
Admission: EM | Admit: 2022-09-30 | Discharge: 2022-10-03 | DRG: 660 | Disposition: A | Discharge status: Home / Self Care | Payer: BC Managed Care – PPO | Attending: Family Medicine | Admitting: Family Medicine

## 2022-09-30 ENCOUNTER — Other Ambulatory Visit: Payer: Self-pay

## 2022-09-30 DIAGNOSIS — N136 Pyonephrosis: Secondary | ICD-10-CM | POA: Diagnosis present

## 2022-09-30 DIAGNOSIS — N390 Urinary tract infection, site not specified: Secondary | ICD-10-CM | POA: Diagnosis present

## 2022-09-30 DIAGNOSIS — F319 Bipolar disorder, unspecified: Secondary | ICD-10-CM | POA: Diagnosis present

## 2022-09-30 DIAGNOSIS — B9620 Unspecified Escherichia coli [E. coli] as the cause of diseases classified elsewhere: Secondary | ICD-10-CM | POA: Diagnosis present

## 2022-09-30 DIAGNOSIS — Z803 Family history of malignant neoplasm of breast: Secondary | ICD-10-CM | POA: Diagnosis not present

## 2022-09-30 DIAGNOSIS — R7881 Bacteremia: Secondary | ICD-10-CM | POA: Diagnosis present

## 2022-09-30 DIAGNOSIS — N132 Hydronephrosis with renal and ureteral calculous obstruction: Secondary | ICD-10-CM

## 2022-09-30 DIAGNOSIS — F909 Attention-deficit hyperactivity disorder, unspecified type: Secondary | ICD-10-CM | POA: Diagnosis present

## 2022-09-30 DIAGNOSIS — Z8349 Family history of other endocrine, nutritional and metabolic diseases: Secondary | ICD-10-CM

## 2022-09-30 DIAGNOSIS — N200 Calculus of kidney: Secondary | ICD-10-CM | POA: Diagnosis present

## 2022-09-30 DIAGNOSIS — Z833 Family history of diabetes mellitus: Secondary | ICD-10-CM

## 2022-09-30 DIAGNOSIS — Z880 Allergy status to penicillin: Secondary | ICD-10-CM | POA: Diagnosis not present

## 2022-09-30 DIAGNOSIS — Z8249 Family history of ischemic heart disease and other diseases of the circulatory system: Secondary | ICD-10-CM

## 2022-09-30 DIAGNOSIS — F17200 Nicotine dependence, unspecified, uncomplicated: Secondary | ICD-10-CM | POA: Diagnosis present

## 2022-09-30 DIAGNOSIS — Z888 Allergy status to other drugs, medicaments and biological substances status: Secondary | ICD-10-CM

## 2022-09-30 DIAGNOSIS — Z79899 Other long term (current) drug therapy: Secondary | ICD-10-CM | POA: Diagnosis not present

## 2022-09-30 DIAGNOSIS — F3170 Bipolar disorder, currently in remission, most recent episode unspecified: Secondary | ICD-10-CM | POA: Diagnosis not present

## 2022-09-30 LAB — CBC WITH DIFFERENTIAL/PLATELET
Abs Immature Granulocytes: 0 10*3/uL (ref 0.00–0.07)
Basophils Absolute: 0 10*3/uL (ref 0.0–0.1)
Basophils Relative: 0 %
Eosinophils Absolute: 0 10*3/uL (ref 0.0–0.5)
Eosinophils Relative: 0 %
HCT: 42.2 % (ref 36.0–46.0)
Hemoglobin: 14.5 g/dL (ref 12.0–15.0)
Immature Granulocytes: 0 %
Lymphocytes Relative: 13 %
Lymphs Abs: 3 10*3/uL (ref 0.7–4.0)
MCH: 31.5 pg (ref 26.0–34.0)
MCHC: 34.4 g/dL (ref 30.0–36.0)
MCV: 91.7 fL (ref 80.0–100.0)
Monocytes Absolute: 0.9 10*3/uL (ref 0.1–1.0)
Monocytes Relative: 4 %
Neutro Abs: 19.1 10*3/uL — ABNORMAL HIGH (ref 1.7–7.7)
Neutrophils Relative %: 83 %
Platelets: 359 10*3/uL (ref 150–400)
RBC: 4.6 MIL/uL (ref 3.87–5.11)
RDW: 13.3 % (ref 11.5–15.5)
Smear Review: NORMAL
WBC: 23 10*3/uL — ABNORMAL HIGH (ref 4.0–10.5)
nRBC: 0 % (ref 0.0–0.2)

## 2022-09-30 LAB — COMPREHENSIVE METABOLIC PANEL
ALT: 38 U/L (ref 0–44)
AST: 38 U/L (ref 15–41)
Albumin: 4.1 g/dL (ref 3.5–5.0)
Alkaline Phosphatase: 72 U/L (ref 38–126)
Anion gap: 9 (ref 5–15)
BUN: 21 mg/dL — ABNORMAL HIGH (ref 6–20)
CO2: 27 mmol/L (ref 22–32)
Calcium: 8.9 mg/dL (ref 8.9–10.3)
Chloride: 98 mmol/L (ref 98–111)
Creatinine, Ser: 0.81 mg/dL (ref 0.44–1.00)
GFR, Estimated: >60 mL/min (ref >60)
Glucose, Bld: 95 mg/dL (ref 70–99)
Potassium: 4 mmol/L (ref 3.5–5.1)
Sodium: 134 mmol/L — ABNORMAL LOW (ref 135–145)
Total Bilirubin: 0.8 mg/dL (ref 0.3–1.2)
Total Protein: 7.5 g/dL (ref 6.5–8.1)

## 2022-09-30 LAB — URINALYSIS, ROUTINE W REFLEX MICROSCOPIC
Bilirubin Urine: NEGATIVE
Glucose, UA: NEGATIVE mg/dL
Hgb urine dipstick: NEGATIVE
Ketones, ur: 80 mg/dL — AB
Leukocytes,Ua: NEGATIVE
Nitrite: POSITIVE — AB
Protein, ur: NEGATIVE mg/dL
Specific Gravity, Urine: 1.025 (ref 1.005–1.030)
pH: 6.5 (ref 5.0–8.0)

## 2022-09-30 LAB — LACTIC ACID, PLASMA: Lactic Acid, Venous: 1.2 mmol/L (ref 0.5–1.9)

## 2022-09-30 LAB — URINALYSIS, MICROSCOPIC (REFLEX)

## 2022-09-30 MED ORDER — ONDANSETRON HCL 4 MG/2ML IJ SOLN
4.0000 mg | Freq: Once | INTRAMUSCULAR | Status: AC
  Administered 2022-09-30: 4 mg via INTRAVENOUS
  Filled 2022-09-30: qty 2

## 2022-09-30 MED ORDER — ONDANSETRON 4 MG PO TBDP
4.0000 mg | ORAL_TABLET | Freq: Once | ORAL | Status: AC
  Administered 2022-09-30: 4 mg via ORAL

## 2022-09-30 MED ORDER — SORBITOL 70 % SOLN: 30.0000 mL | Freq: Every day | Status: DC | PRN

## 2022-09-30 MED ORDER — SODIUM CHLORIDE 0.9 % IV SOLN: INTRAVENOUS | Status: DC | PRN

## 2022-09-30 MED ORDER — ONDANSETRON HCL 4 MG PO TABS: 4.0000 mg | ORAL_TABLET | Freq: Four times a day (QID) | ORAL | Status: DC | PRN

## 2022-09-30 MED ORDER — SODIUM CHLORIDE 0.9 % IV SOLN
1.0000 g | INTRAVENOUS | Status: DC
  Administered 2022-10-01: 1 g via INTRAVENOUS
  Filled 2022-09-30: qty 10

## 2022-09-30 MED ORDER — ACETAMINOPHEN 325 MG PO TABS: 650.0000 mg | ORAL_TABLET | Freq: Four times a day (QID) | ORAL | Status: DC | PRN

## 2022-09-30 MED ORDER — PROMETHAZINE HCL 25 MG PO TABS
25.0000 mg | ORAL_TABLET | Freq: Once | ORAL | Status: AC
  Administered 2022-09-30: 25 mg via ORAL
  Filled 2022-09-30: qty 1

## 2022-09-30 MED ORDER — ONDANSETRON 4 MG PO TBDP
ORAL_TABLET | ORAL | Status: AC
  Filled 2022-09-30: qty 1

## 2022-09-30 MED ORDER — LACTATED RINGERS IV SOLN: INTRAVENOUS | Status: DC

## 2022-09-30 MED ORDER — ACETAMINOPHEN 650 MG RE SUPP: 650.0000 mg | Freq: Four times a day (QID) | RECTAL | Status: DC | PRN

## 2022-09-30 MED ORDER — ONDANSETRON HCL 4 MG/2ML IJ SOLN: 4.0000 mg | Freq: Four times a day (QID) | INTRAMUSCULAR | Status: DC | PRN

## 2022-09-30 MED ORDER — KETOROLAC TROMETHAMINE 30 MG/ML IJ SOLN
30.0000 mg | Freq: Once | INTRAMUSCULAR | Status: AC
  Administered 2022-09-30: 30 mg via INTRAVENOUS
  Filled 2022-09-30: qty 1

## 2022-09-30 MED ORDER — SODIUM CHLORIDE 0.9 % IV SOLN
1.0000 g | Freq: Once | INTRAVENOUS | Status: AC
  Administered 2022-09-30: 1 g via INTRAVENOUS
  Filled 2022-09-30: qty 10

## 2022-09-30 NOTE — ED Notes (Signed)
[facility] called for tranpsort. 

## 2022-09-30 NOTE — ED Notes (Signed)
Pt tolerating PO intake, with requests for d/c.  EDP Schlossman made aware.

## 2022-09-30 NOTE — ED Triage Notes (Addendum)
Pt arrives with right sided flank pain that started about 3-4 hours ago. Pt endorses n/v. Pt recently treated for a UTI. Pt denies dysuria or hematuria. Per pt, pain does radiate into her ABD and groin.

## 2022-09-30 NOTE — ED Notes (Signed)
[facility] at bedside 

## 2022-09-30 NOTE — ED Notes (Signed)
Patient ambulated to bathroom with no assistance.

## 2022-09-30 NOTE — ED Notes (Signed)
Pt provided saltine crackers for PO challenge

## 2022-09-30 NOTE — Progress Notes (Signed)
Plan of Care Note for accepted transfer   Patient: Bianca Sanders MRN: [id number]   DOA: 09/30/2022  Facility requesting transfer: [facility]   Requesting Provider: Dr. Dalene Seltzer   Reason for transfer: Complicated UTI  Facility course: 57 yr old lady who p/w right flank pain and N/V that developed today. She reports being treated for UTI beginning 6/20 and had resolution of urinary sxs prior to developing flank pain and N/V today.   She is afebrile in ED with stable BP, WBC 23, normal lactate, and normal creatinine. CT demonstrates moderate right hydronephrosis with perinephric stranding, multiple small stones in right renal pelvis and distal ureter, and small amount of gas in right renal collecting system and bladder.   Dr. Pete Glatter of urology was consulted and is considering ureteral stent placement. Blood and urine cultures were collected and she was treated with Rocephin, Toradol, 2 doses of Zofran, and Phenergan.   Plan of care: The patient is accepted for admission to Telemetry unit, at [facility].   Author: Briscoe Deutscher, MD 09/30/2022  Check [email or web address] for on-call coverage.  Nursing staff, Please call [facility] Admits & Consults System-Wide number on Amion as soon as patient's arrival, so appropriate admitting provider can evaluate the pt.

## 2022-09-30 NOTE — Anesthesia Preprocedure Evaluation (Addendum)
Anesthesia Evaluation  Patient identified by MRN, date of birth, ID band Patient awake    Reviewed: Allergy & Precautions, NPO status , Patient's Chart, lab work & pertinent test results  History of Anesthesia Complications Negative for: history of anesthetic complications  Airway Mallampati: I  TM Distance: >3 FB Neck ROM: Full    Dental  (+) Teeth Intact, Dental Advisory Given   Pulmonary neg shortness of breath, neg sleep apnea, neg COPD, neg recent URI, Current Smoker   Pulmonary exam normal breath sounds clear to auscultation       Cardiovascular  Rhythm:Regular Rate:Normal     Neuro/Psych negative neurological ROS     GI/Hepatic negative GI ROS, Neg liver ROS,,,  Endo/Other  negative endocrine ROS    Renal/GU Renal disease (right ureteral obstruction/stone)     Musculoskeletal   Abdominal   Peds  Hematology negative hematology ROS (+)   Anesthesia Other Findings   Reproductive/Obstetrics                             Anesthesia Physical Anesthesia Plan  ASA: 2  Anesthesia Plan: General   Post-op Pain Management:    Induction: Intravenous  PONV Risk Score and Plan: 2 and Ondansetron, Dexamethasone and Treatment may vary due to age or medical condition  Airway Management Planned: LMA  Additional Equipment:   Intra-op Plan:   Post-operative Plan: Extubation in OR  Informed Consent: I have reviewed the patients History and Physical, chart, labs and discussed the procedure including the risks, benefits and alternatives for the proposed anesthesia with the patient or authorized representative who has indicated his/her understanding and acceptance.     Dental advisory given  Plan Discussed with: CRNA and Anesthesiologist  Anesthesia Plan Comments: (Risks of general anesthesia discussed including, but not limited to, sore throat, hoarse voice, chipped/damaged teeth, injury  to vocal cords, nausea and vomiting, allergic reactions, lung infection, heart attack, stroke, and death. All questions answered. )       Anesthesia Quick Evaluation

## 2022-09-30 NOTE — ED Provider Notes (Signed)
[facility] Provider Note   CSN: [id number] Arrival date & time: 09/30/22  1522     History {Add pertinent medical, surgical, social history, OB history to HPI:1} Chief Complaint  Patient presents with   Flank Pain    Bianca Sanders is a 57 y.o. female.  HPI      6/20 prescribed ?bactrim Today right sided flank pain   History reviewed. No pertinent past medical history.  Home Medications Prior to Admission medications   Medication Sig Start Date End Date Taking? Authorizing Provider  Estradiol (DIVIGEL) 1 MG/GM GEL Place onto the skin every morning.    [provider]  progesterone (PROMETRIUM) 100 MG capsule Take 1 capsule by mouth daily. 12/25/13   [provider]  zolpidem (AMBIEN) 5 MG tablet Take 1 tablet by mouth at bedtime. 11/27/13   [provider]      Allergies    Neosporin [bacitracin-polymyxin b], Penicillins, and Tramadol    Review of Systems   Review of Systems  Physical Exam Updated Vital Signs BP (!) 172/82   Pulse (!) 48   Temp 97.8 F (36.6 C) (Oral)   Resp (!) 24   Ht 5\' 3"  (1.6 m)   Wt 61.2 kg   LMP 12/15/2013   SpO2 100%   BMI 23.91 kg/m  Physical Exam  ED Results / Procedures / Treatments   Labs (all labs ordered are listed, but only abnormal results are displayed) Labs Reviewed  URINALYSIS, ROUTINE W REFLEX MICROSCOPIC - Abnormal; Notable for the following components:      Result Value   Ketones, ur 80 (*)    Nitrite POSITIVE (*)    All other components within normal limits  CBC WITH DIFFERENTIAL/PLATELET - Abnormal; Notable for the following components:   WBC 23.0 (*)    Neutro Abs 19.1 (*)    All other components within normal limits  COMPREHENSIVE METABOLIC PANEL - Abnormal; Notable for the following components:   Sodium 134 (*)    BUN 21 (*)    All other components within normal limits  URINALYSIS, MICROSCOPIC (REFLEX) - Abnormal; Notable for  the following components:   Bacteria, UA MANY (*)    All other components within normal limits  CULTURE, BLOOD (ROUTINE X 2)  CULTURE, BLOOD (ROUTINE X 2)  URINE CULTURE  LACTIC ACID, PLASMA    EKG None  Radiology CT RENAL STONE STUDY  Result Date: 09/30/2022 CLINICAL DATA:  Right-sided flank pain for several hours. Nausea and vomiting. EXAM: CT ABDOMEN AND PELVIS WITHOUT CONTRAST TECHNIQUE: Multidetector CT imaging of the abdomen and pelvis was performed following the standard protocol without IV contrast. RADIATION DOSE REDUCTION: This exam was performed according to the departmental dose-optimization program which includes automated exposure control, adjustment of the mA and/or kV according to patient size and/or use of iterative reconstruction technique. COMPARISON:  None Available. FINDINGS: Lower chest: No acute findings. Hepatobiliary: No mass visualized on this unenhanced exam. Gallbladder is unremarkable. No evidence of biliary ductal dilatation. Pancreas: No mass or inflammatory process visualized on this unenhanced exam. Spleen:  Within normal limits in size. Adrenals/Urinary tract: Several tiny 1-2 mm left renal calculi are seen, however there is no evidence of left-sided hydronephrosis. Right renal swelling is seen with multiple right intrarenal calculi measuring up to 6 mm. Moderate right hydronephrosis and perinephric stranding are seen, with multiple small calculi in the mid pelvic and distal portions of the right ureter. A small amount of gas  is seen in the right renal collecting system and bladder, which may be due to recent instrumentation or gas-forming infection. The urinary bladder is empty, and therefore difficult to evaluate. Stomach/Bowel: No evidence of obstruction, inflammatory process, or abnormal fluid collections. Vascular/Lymphatic: No pathologically enlarged lymph nodes identified. No evidence of abdominal aortic aneurysm. Reproductive:  No mass or other significant  abnormality. Other:  None. Musculoskeletal: No suspicious bone lesions identified. Left hip prosthesis noted. IMPRESSION: Moderate right hydronephrosis and perinephric stranding, with multiple small calculi in the mid pelvic and distal portions of the right ureter. Small amount of gas in right renal collecting system and bladder, which may be due to recent instrumentation or gas-forming infection. Recommend correlation with urinalysis. Tiny nonobstructing left renal calculi. Electronically Signed   By: Danae Orleans M.D.   On: 09/30/2022 16:45    Procedures Procedures  {Document cardiac monitor, telemetry assessment procedure when appropriate:1}  Medications Ordered in ED Medications  0.9 %  sodium chloride infusion (0 mLs Intravenous Stopped 09/30/22 1902)  ondansetron (ZOFRAN-ODT) disintegrating tablet 4 mg (4 mg Oral Given 09/30/22 1542)  cefTRIAXone (ROCEPHIN) 1 g in sodium chloride 0.9 % 100 mL IVPB (0 g Intravenous Stopped 09/30/22 1902)  ketorolac (TORADOL) 30 MG/ML injection 30 mg (30 mg Intravenous Given 09/30/22 1859)  ondansetron (ZOFRAN) injection 4 mg (4 mg Intravenous Given 09/30/22 1859)  promethazine (PHENERGAN) tablet 25 mg (25 mg Oral Given 09/30/22 2003)    ED Course/ Medical Decision Making/ A&P   {   Click here for ABCD2, HEART and other calculatorsREFRESH Note before signing :1}                          Medical Decision Making Amount and/or Complexity of Data Reviewed Labs: ordered. Radiology: ordered.  Risk Prescription drug management.   ***  {Document critical care time when appropriate:1} {Document review of labs and clinical decision tools ie heart score, Chads2Vasc2 etc:1}  {Document your independent review of radiology images, and any outside records:1} {Document your discussion with family members, caretakers, and with consultants:1} {Document social determinants of health affecting pt's care:1} {Document your decision making why or why not admission,  treatments were needed:1} Final Clinical Impression(s) / ED Diagnoses Final diagnoses:  None    Rx / DC Orders ED Discharge Orders     None

## 2022-09-30 NOTE — Consult Note (Signed)
Urology Consult   Physician requesting consult: Alvira Monday, MD  Reason for consult: Right ureteral calculus with obstruction, UTI  History of Present Illness: Bianca Sanders is a 57 y.o. female seen in consultation from Dr. Dalene Seltzer for evaluation of right ureteral calculi with obstruction and UTI.  She presented to the emergency room earlier today with acute onset of right-sided flank pain.  She had associated nausea and vomiting.  No fevers or chills.  No dysuria or gross hematuria.  Urinalysis was nitrite positive with many bacteria.  WBC 23 K. CT renal stone study showed right hydronephrosis with multiple right renal calculi, multiple small calculi in the mid and distal right ureter with a small amount of gas noted in the right renal collecting system and bladder.  She received IV Rocephin in the emergency room.  She has had continued nausea with emesis.  Her pain has been fairly well-controlled. No prior history of nephrolithiasis.  History reviewed. No pertinent past medical history.  Past Surgical History:  Procedure Laterality Date   ABDOMINOPLASTY     MASTOPEXY     WISDOM TOOTH EXTRACTION      Medications:  Scheduled Meds: Continuous Infusions:  sodium chloride Stopped (09/30/22 1902)   PRN Meds:.sodium chloride  Allergies:  Allergies  Allergen Reactions   Neosporin [Bacitracin-Polymyxin B] Other (See Comments)    Blisters   Penicillins Hives   Tramadol Other (See Comments)    "Feeling weird"    Family History  Problem Relation Age of Onset   Hypertension Mother    Thyroid disease Mother        hypothyroid   Atrial fibrillation Mother    Heart failure Father        CHF   Diabetes Father    Cancer Father        bone   Breast cancer Sister 73       estrogen fed   Hypertension Brother    Thyroid disease Maternal Grandfather    Diabetes Paternal Grandfather    Hypertension Brother     Social History:  reports that she has been smoking. She has  never used smokeless tobacco. She reports current alcohol use of about 4.0 standard drinks of alcohol per week. She reports that she does not use drugs.  ROS: A complete review of systems was performed.  All systems are negative except for pertinent findings as noted.  Physical Exam:  Vital signs in last 24 hours: Temp:  [97.8 F (36.6 C)-98 F (36.7 C)] 98 F (36.7 C) (06/30 2206) Pulse Rate:  [48-60] 48 (06/30 2000) Resp:  [18-25] 20 (06/30 2023) BP: (166-174)/(82-91) 172/82 (06/30 2000) SpO2:  [100 %] 100 % (06/30 2000) Weight:  [61.2 kg] 61.2 kg (06/30 1528) GENERAL APPEARANCE:  Well appearing, well developed, well nourished, NAD HEENT:  Atraumatic, normocephalic, oropharynx clear NECK:  Supple  ABDOMEN:  Soft, non-tender, no masses EXTREMITIES:  Moves all extremities well, without clubbing, cyanosis, or edema NEUROLOGIC:  Alert and oriented x 3, CN II-XII grossly intact MENTAL STATUS:  appropriate BACK:  Non-tender to palpation, Right CVAT SKIN:  Warm, dry, and intact  Laboratory Data:  Recent Labs    09/30/22 1533  WBC 23.0*  HGB 14.5  HCT 42.2  PLT 359    Recent Labs    09/30/22 1533  NA 134*  K 4.0  CL 98  GLUCOSE 95  BUN 21*  CALCIUM 8.9  CREATININE 0.81     Results for orders placed or performed during the  hospital encounter of 09/30/22 (from the past 24 hour(s))  Urinalysis, Routine w reflex microscopic -Urine, Clean Catch     Status: Abnormal   Collection Time: 09/30/22  3:32 PM  Result Value Ref Range   Color, Urine YELLOW YELLOW   APPearance CLEAR CLEAR   Specific Gravity, Urine 1.025 1.005 - 1.030   pH 6.5 5.0 - 8.0   Glucose, UA NEGATIVE NEGATIVE mg/dL   Hgb urine dipstick NEGATIVE NEGATIVE   Bilirubin Urine NEGATIVE NEGATIVE   Ketones, ur 80 (A) NEGATIVE mg/dL   Protein, ur NEGATIVE NEGATIVE mg/dL   Nitrite POSITIVE (A) NEGATIVE   Leukocytes,Ua NEGATIVE NEGATIVE  Urinalysis, Microscopic (reflex)     Status: Abnormal   Collection Time:  09/30/22  3:32 PM  Result Value Ref Range   RBC / HPF 0-5 0 - 5 RBC/hpf   WBC, UA 0-5 0 - 5 WBC/hpf   Bacteria, UA MANY (A) NONE SEEN   Squamous Epithelial / HPF 0-5 0 - 5 /HPF  CBC with Differential (PNL)     Status: Abnormal   Collection Time: 09/30/22  3:33 PM  Result Value Ref Range   WBC 23.0 (H) 4.0 - 10.5 K/uL   RBC 4.60 3.87 - 5.11 MIL/uL   Hemoglobin 14.5 12.0 - 15.0 g/dL   HCT 16.1 09.6 - 04.5 %   MCV 91.7 80.0 - 100.0 fL   MCH 31.5 26.0 - 34.0 pg   MCHC 34.4 30.0 - 36.0 g/dL   RDW 40.9 81.1 - 91.4 %   Platelets 359 150 - 400 K/uL   nRBC 0.0 0.0 - 0.2 %   Neutrophils Relative % 83 %   Neutro Abs 19.1 (H) 1.7 - 7.7 K/uL   Lymphocytes Relative 13 %   Lymphs Abs 3.0 0.7 - 4.0 K/uL   Monocytes Relative 4 %   Monocytes Absolute 0.9 0.1 - 1.0 K/uL   Eosinophils Relative 0 %   Eosinophils Absolute 0.0 0.0 - 0.5 K/uL   Basophils Relative 0 %   Basophils Absolute 0.0 0.0 - 0.1 K/uL   WBC Morphology TOXIC GRANULATION    RBC Morphology MORPHOLOGY UNREMARKABLE    Smear Review Normal platelet morphology    Immature Granulocytes 0 %   Abs Immature Granulocytes 0.00 0.00 - 0.07 K/uL  Comprehensive metabolic panel     Status: Abnormal   Collection Time: 09/30/22  3:33 PM  Result Value Ref Range   Sodium 134 (L) 135 - 145 mmol/L   Potassium 4.0 3.5 - 5.1 mmol/L   Chloride 98 98 - 111 mmol/L   CO2 27 22 - 32 mmol/L   Glucose, Bld 95 70 - 99 mg/dL   BUN 21 (H) 6 - 20 mg/dL   Creatinine, Ser 7.82 0.44 - 1.00 mg/dL   Calcium 8.9 8.9 - 95.6 mg/dL   Total Protein 7.5 6.5 - 8.1 g/dL   Albumin 4.1 3.5 - 5.0 g/dL   AST 38 15 - 41 U/L   ALT 38 0 - 44 U/L   Alkaline Phosphatase 72 38 - 126 U/L   Total Bilirubin 0.8 0.3 - 1.2 mg/dL   GFR, Estimated >21 >30 mL/min   Anion gap 9 5 - 15  Lactic acid, plasma     Status: None   Collection Time: 09/30/22  5:27 PM  Result Value Ref Range   Lactic Acid, Venous 1.2 0.5 - 1.9 mmol/L   No results found for this or any previous visit (from  the past 240 hour(s)).  Renal Function: Recent Labs    09/30/22 1533  CREATININE 0.81   Estimated Creatinine Clearance: 64.2 mL/min (by C-G formula based on SCr of 0.81 mg/dL).  Radiologic Imaging: CT RENAL STONE STUDY  Result Date: 09/30/2022 CLINICAL DATA:  Right-sided flank pain for several hours. Nausea and vomiting. EXAM: CT ABDOMEN AND PELVIS WITHOUT CONTRAST TECHNIQUE: Multidetector CT imaging of the abdomen and pelvis was performed following the standard protocol without IV contrast. RADIATION DOSE REDUCTION: This exam was performed according to the departmental dose-optimization program which includes automated exposure control, adjustment of the mA and/or kV according to patient size and/or use of iterative reconstruction technique. COMPARISON:  None Available. FINDINGS: Lower chest: No acute findings. Hepatobiliary: No mass visualized on this unenhanced exam. Gallbladder is unremarkable. No evidence of biliary ductal dilatation. Pancreas: No mass or inflammatory process visualized on this unenhanced exam. Spleen:  Within normal limits in size. Adrenals/Urinary tract: Several tiny 1-2 mm left renal calculi are seen, however there is no evidence of left-sided hydronephrosis. Right renal swelling is seen with multiple right intrarenal calculi measuring up to 6 mm. Moderate right hydronephrosis and perinephric stranding are seen, with multiple small calculi in the mid pelvic and distal portions of the right ureter. A small amount of gas is seen in the right renal collecting system and bladder, which may be due to recent instrumentation or gas-forming infection. The urinary bladder is empty, and therefore difficult to evaluate. Stomach/Bowel: No evidence of obstruction, inflammatory process, or abnormal fluid collections. Vascular/Lymphatic: No pathologically enlarged lymph nodes identified. No evidence of abdominal aortic aneurysm. Reproductive:  No mass or other significant abnormality. Other:   None. Musculoskeletal: No suspicious bone lesions identified. Left hip prosthesis noted. IMPRESSION: Moderate right hydronephrosis and perinephric stranding, with multiple small calculi in the mid pelvic and distal portions of the right ureter. Small amount of gas in right renal collecting system and bladder, which may be due to recent instrumentation or gas-forming infection. Recommend correlation with urinalysis. Tiny nonobstructing left renal calculi. Electronically Signed   By: Danae Orleans M.D.   On: 09/30/2022 16:45    I independently reviewed the above imaging studies.  Impression/Recommendation Right ureteral calculi with obstruction UTI with evidence of gas in bladder and right collecting system  I discussed the laboratory and imaging results with the patient and her husband tonight.  Given the evidence of a UTI with obstruction from ureteral calculi and evidence of gas in the right collecting system and bladder suggestive of a gas-forming UTI, I have recommended surgical management with cystoscopy and right ureteral stent placement.  The goal of the procedure for decompression of the right ureter ureteral obstruction to allow clearing of the UTI was discussed.  I explained that no attempt will be made at removal of the ureteral calculi in the setting of an infection.  Risk and benefits of the procedure were discussed.  She understands wished to proceed as described. The patient will be transferred to [facility] for admission to the hospitalist service and subsequent surgical management. Continue broad-spectrum IV antibiotics. Await culture results. Pain medication and antiemetics as needed.  Procedure: The patient will be scheduled for cystoscopy, right retrograde pyelogram, right ureteral stent placement at [facility].  Surgical request is placed with the surgery schedulers and will be scheduled at the patient's/family request. Informed consent is given as documented below. Anesthesia:  General  The patient does not have sleep apnea, history of MRSA, history of VRE, history of cardiac device requiring special anesthetic  needs. Patient is stable and considered clear for surgical in an outpatient ambulatory surgery setting as well as patient hospital setting.  Consent for Operation or Procedure: Provider Certification I hereby certify that the nature, purpose, benefits, usual and most frequent risks of, and alternatives to, the operation or procedure have been explained to the patient (or person authorized to sign for the patient) either by me as responsible physician or by the provider who is to perform the operation or procedure. Time spent such that the patient/family has had an opportunity to ask questions, and that those questions have been answered. The patient or the patient's representative has been advised that selected tasks may be performed by assistants to the primary health care provider(s). I believe that the patient (or person authorized to sign for the patient) understands what has been explained, and has consented to the operation or procedure. No guarantees were implied or made.   Elige Radon Aadhya Bustamante 09/30/2022, 10:14 PM

## 2022-10-01 ENCOUNTER — Inpatient Hospital Stay (HOSPITAL_COMMUNITY): Payer: BC Managed Care – PPO

## 2022-10-01 ENCOUNTER — Encounter (HOSPITAL_COMMUNITY)
Admission: EM | Disposition: A | Discharge status: Home / Self Care | Payer: Self-pay | Source: Home / Self Care | Attending: Family Medicine

## 2022-10-01 ENCOUNTER — Inpatient Hospital Stay (HOSPITAL_COMMUNITY): Payer: BC Managed Care – PPO | Admitting: Anesthesiology

## 2022-10-01 DIAGNOSIS — F909 Attention-deficit hyperactivity disorder, unspecified type: Secondary | ICD-10-CM | POA: Diagnosis not present

## 2022-10-01 DIAGNOSIS — N132 Hydronephrosis with renal and ureteral calculous obstruction: Secondary | ICD-10-CM

## 2022-10-01 DIAGNOSIS — F3170 Bipolar disorder, currently in remission, most recent episode unspecified: Secondary | ICD-10-CM | POA: Diagnosis not present

## 2022-10-01 HISTORY — PX: CYSTOSCOPY W/ URETERAL STENT PLACEMENT: SHX1429

## 2022-10-01 LAB — COMPREHENSIVE METABOLIC PANEL
ALT: 38 U/L (ref 0–44)
AST: 37 U/L (ref 15–41)
Albumin: 3.4 g/dL — ABNORMAL LOW (ref 3.5–5.0)
Alkaline Phosphatase: 63 U/L (ref 38–126)
Anion gap: 10 (ref 5–15)
BUN: 19 mg/dL (ref 6–20)
CO2: 24 mmol/L (ref 22–32)
Calcium: 8.4 mg/dL — ABNORMAL LOW (ref 8.9–10.3)
Chloride: 101 mmol/L (ref 98–111)
Creatinine, Ser: 0.89 mg/dL (ref 0.44–1.00)
GFR, Estimated: >60 mL/min (ref >60)
Glucose, Bld: 138 mg/dL — ABNORMAL HIGH (ref 70–99)
Potassium: 4 mmol/L (ref 3.5–5.1)
Sodium: 135 mmol/L (ref 135–145)
Total Bilirubin: 0.7 mg/dL (ref 0.3–1.2)
Total Protein: 6.6 g/dL (ref 6.5–8.1)

## 2022-10-01 LAB — BLOOD CULTURE ID PANEL (REFLEXED) - BCID2

## 2022-10-01 LAB — CULTURE, BLOOD (ROUTINE X 2)

## 2022-10-01 LAB — HIV ANTIBODY (ROUTINE TESTING W REFLEX): HIV Screen 4th Generation wRfx: NONREACTIVE

## 2022-10-01 LAB — MAGNESIUM: Magnesium: 1.9 mg/dL (ref 1.7–2.4)

## 2022-10-01 SURGERY — CYSTOSCOPY, WITH RETROGRADE PYELOGRAM AND URETERAL STENT INSERTION
Anesthesia: General | Laterality: Right

## 2022-10-01 MED ORDER — FENTANYL CITRATE PF 50 MCG/ML IJ SOSY: 25.0000 ug | PREFILLED_SYRINGE | INTRAMUSCULAR | Status: DC | PRN

## 2022-10-01 MED ORDER — ONDANSETRON HCL 4 MG/2ML IJ SOLN
INTRAMUSCULAR | Status: AC
  Filled 2022-10-01: qty 2

## 2022-10-01 MED ORDER — AMISULPRIDE (ANTIEMETIC) 5 MG/2ML IV SOLN: 10.0000 mg | Freq: Once | INTRAVENOUS | Status: DC | PRN

## 2022-10-01 MED ORDER — FENTANYL CITRATE (PF) 100 MCG/2ML IJ SOLN
INTRAMUSCULAR | Status: DC | PRN
  Administered 2022-10-01 (×4): 25 ug via INTRAVENOUS

## 2022-10-01 MED ORDER — FENTANYL CITRATE (PF) 100 MCG/2ML IJ SOLN
INTRAMUSCULAR | Status: AC
  Filled 2022-10-01: qty 2

## 2022-10-01 MED ORDER — PROPOFOL 10 MG/ML IV BOLUS
INTRAVENOUS | Status: DC | PRN
  Administered 2022-10-01: 150 mg via INTRAVENOUS

## 2022-10-01 MED ORDER — PHENYLEPHRINE 80 MCG/ML (10ML) SYRINGE FOR IV PUSH (FOR BLOOD PRESSURE SUPPORT)
PREFILLED_SYRINGE | INTRAVENOUS | Status: AC
  Filled 2022-10-01: qty 10

## 2022-10-01 MED ORDER — BUSPIRONE HCL 10 MG PO TABS
10.0000 mg | ORAL_TABLET | Freq: Two times a day (BID) | ORAL | Status: DC
  Administered 2022-10-01 – 2022-10-03 (×5): 10 mg via ORAL
  Filled 2022-10-01 (×5): qty 1

## 2022-10-01 MED ORDER — OXYBUTYNIN CHLORIDE 5 MG PO TABS: 5.0000 mg | ORAL_TABLET | Freq: Three times a day (TID) | ORAL | Status: DC | PRN

## 2022-10-01 MED ORDER — CARIPRAZINE HCL 1.5 MG PO CAPS
1.5000 mg | ORAL_CAPSULE | Freq: Every day | ORAL | Status: DC
  Administered 2022-10-01: 1.5 mg via ORAL
  Filled 2022-10-01 (×2): qty 1

## 2022-10-01 MED ORDER — STERILE WATER FOR IRRIGATION IR SOLN
Status: DC | PRN
  Administered 2022-10-01: 3000 mL

## 2022-10-01 MED ORDER — OXYCODONE HCL 5 MG PO TABS: 5.0000 mg | ORAL_TABLET | Freq: Once | ORAL | Status: DC | PRN

## 2022-10-01 MED ORDER — METHYLPHENIDATE HCL ER (PM) 100 MG PO CP24
100.0000 mg | ORAL_CAPSULE | Freq: Every day | ORAL | Status: DC
  Filled 2022-10-01: qty 1

## 2022-10-01 MED ORDER — SODIUM CHLORIDE 0.9 % IV SOLN
2.0000 g | INTRAVENOUS | Status: DC
  Administered 2022-10-02 – 2022-10-03 (×2): 2 g via INTRAVENOUS
  Filled 2022-10-01 (×2): qty 20

## 2022-10-01 MED ORDER — HYDROMORPHONE HCL 1 MG/ML IJ SOLN
0.5000 mg | INTRAMUSCULAR | Status: DC | PRN
  Administered 2022-10-01 – 2022-10-03 (×8): 0.5 mg via INTRAVENOUS
  Filled 2022-10-01 (×8): qty 0.5

## 2022-10-01 MED ORDER — KETOROLAC TROMETHAMINE 15 MG/ML IJ SOLN: 15.0000 mg | Freq: Four times a day (QID) | INTRAMUSCULAR | Status: DC | PRN

## 2022-10-01 MED ORDER — DEXAMETHASONE SODIUM PHOSPHATE 10 MG/ML IJ SOLN
INTRAMUSCULAR | Status: AC
  Filled 2022-10-01: qty 1

## 2022-10-01 MED ORDER — IOHEXOL 300 MG/ML  SOLN
INTRAMUSCULAR | Status: DC | PRN
  Administered 2022-10-01: 50 mL via URETHRAL

## 2022-10-01 MED ORDER — PHENYLEPHRINE 80 MCG/ML (10ML) SYRINGE FOR IV PUSH (FOR BLOOD PRESSURE SUPPORT)
PREFILLED_SYRINGE | INTRAVENOUS | Status: DC | PRN
  Administered 2022-10-01: 80 ug via INTRAVENOUS

## 2022-10-01 MED ORDER — OXYCODONE HCL 5 MG/5ML PO SOLN: 5.0000 mg | Freq: Once | ORAL | Status: DC | PRN

## 2022-10-01 MED ORDER — PROMETHAZINE HCL 25 MG/ML IJ SOLN: 6.2500 mg | INTRAMUSCULAR | Status: DC | PRN

## 2022-10-01 MED ORDER — SODIUM CHLORIDE 0.9 % IV SOLN
1.0000 g | INTRAVENOUS | Status: AC
  Administered 2022-10-01: 1 g via INTRAVENOUS
  Filled 2022-10-01: qty 10

## 2022-10-01 MED ORDER — DEXAMETHASONE SODIUM PHOSPHATE 4 MG/ML IJ SOLN
INTRAMUSCULAR | Status: DC | PRN
  Administered 2022-10-01: 8 mg via INTRAVENOUS

## 2022-10-01 MED ORDER — ONDANSETRON HCL 4 MG/2ML IJ SOLN
INTRAMUSCULAR | Status: DC | PRN
  Administered 2022-10-01: 4 mg via INTRAVENOUS

## 2022-10-01 MED ORDER — DULOXETINE HCL 60 MG PO CPEP
60.0000 mg | ORAL_CAPSULE | Freq: Every day | ORAL | Status: DC
  Administered 2022-10-01 – 2022-10-03 (×3): 60 mg via ORAL
  Filled 2022-10-01 (×3): qty 1

## 2022-10-01 MED ORDER — DEXMEDETOMIDINE HCL IN NACL 80 MCG/20ML IV SOLN
INTRAVENOUS | Status: DC | PRN
  Administered 2022-10-01: 12 ug via INTRAVENOUS

## 2022-10-01 MED ORDER — DEXMEDETOMIDINE HCL IN NACL 80 MCG/20ML IV SOLN
INTRAVENOUS | Status: AC
  Filled 2022-10-01: qty 20

## 2022-10-01 MED ORDER — LIDOCAINE HCL (CARDIAC) PF 100 MG/5ML IV SOSY
PREFILLED_SYRINGE | INTRAVENOUS | Status: DC | PRN
  Administered 2022-10-01: 60 mg via INTRAVENOUS

## 2022-10-01 MED ORDER — POLYETHYLENE GLYCOL 3350 17 G PO PACK
17.0000 g | PACK | Freq: Every day | ORAL | Status: DC
  Administered 2022-10-01 – 2022-10-03 (×3): 17 g via ORAL
  Filled 2022-10-01 (×3): qty 1

## 2022-10-01 MED ORDER — PROPOFOL 10 MG/ML IV BOLUS
INTRAVENOUS | Status: AC
  Filled 2022-10-01: qty 20

## 2022-10-01 MED ORDER — LIDOCAINE HCL (PF) 2 % IJ SOLN
INTRAMUSCULAR | Status: AC
  Filled 2022-10-01: qty 5

## 2022-10-01 SURGICAL SUPPLY — 25 items
BAG COUNTER SPONGE SURGICOUNT (BAG) IMPLANT
BAG SPNG CNTER NS LX DISP (BAG)
BAG URO CATCHER STRL LF (MISCELLANEOUS) ×1 IMPLANT
BASKET ZERO TIP NITINOL 2.4FR (BASKET) IMPLANT
BSKT STON RTRVL ZERO TP 2.4FR (BASKET)
CATH URETL OPEN 5X70 (CATHETERS) IMPLANT
CLOTH BEACON ORANGE TIMEOUT ST (SAFETY) ×1 IMPLANT
GLOVE SS BIOGEL STRL SZ 8 (GLOVE) ×1 IMPLANT
GOWN STRL REUS W/ TWL XL LVL3 (GOWN DISPOSABLE) ×1 IMPLANT
GOWN STRL REUS W/TWL XL LVL3 (GOWN DISPOSABLE) ×1
GUIDEWIRE STR DUAL SENSOR (WIRE) ×1 IMPLANT
GUIDEWIRE ZIPWRE .038 STRAIGHT (WIRE) IMPLANT
IV NS 1000ML (IV SOLUTION) ×1
IV NS 1000ML BAXH (IV SOLUTION) ×1 IMPLANT
KIT TURNOVER KIT A (KITS) IMPLANT
LASER FIB FLEXIVA PULSE ID 365 (Laser) IMPLANT
MANIFOLD NEPTUNE II (INSTRUMENTS) ×1 IMPLANT
PACK CYSTO (CUSTOM PROCEDURE TRAY) ×1 IMPLANT
SHEATH NAVIGATOR HD 12/14X36 (SHEATH) IMPLANT
STENT URET 6FRX24 CONTOUR (STENTS) IMPLANT
TRACTIP FLEXIVA PULS ID 200XHI (Laser) IMPLANT
TRACTIP FLEXIVA PULSE ID 200 (Laser)
TRAY FOLEY MTR SLVR 16FR STAT (SET/KITS/TRAYS/PACK) IMPLANT
TUBING CONNECTING 10 (TUBING) ×1 IMPLANT
TUBING UROLOGY SET (TUBING) ×1 IMPLANT

## 2022-10-01 NOTE — Progress Notes (Signed)
PHARMACY - PHYSICIAN COMMUNICATION CRITICAL VALUE ALERT - BLOOD CULTURE IDENTIFICATION (BCID)  Bianca Sanders is an 57 y.o. female who presented to [facility] on 09/30/2022 with a chief complaint of Right ureteral calculus with obstruction and UTI   Name of physician (or Provider) Contacted: Lama  Current antibiotics: Rocephin 1g IV q24h  Changes to prescribed antibiotics recommended:  Increase Rocephin to 2g IV q24h  Results for orders placed or performed during the hospital encounter of 09/30/22  Blood Culture ID Panel (Reflexed) (Collected: 09/30/2022  5:27 PM)  Result Value Ref Range   Enterococcus faecalis NOT DETECTED NOT DETECTED   Enterococcus Faecium NOT DETECTED NOT DETECTED   Listeria monocytogenes NOT DETECTED NOT DETECTED   Staphylococcus species NOT DETECTED NOT DETECTED   Staphylococcus aureus (BCID) NOT DETECTED NOT DETECTED   Staphylococcus epidermidis NOT DETECTED NOT DETECTED   Staphylococcus lugdunensis NOT DETECTED NOT DETECTED   Streptococcus species NOT DETECTED NOT DETECTED   Streptococcus agalactiae NOT DETECTED NOT DETECTED   Streptococcus pneumoniae NOT DETECTED NOT DETECTED   Streptococcus pyogenes NOT DETECTED NOT DETECTED   A.calcoaceticus-baumannii NOT DETECTED NOT DETECTED   Bacteroides fragilis NOT DETECTED NOT DETECTED   Enterobacterales DETECTED (A) NOT DETECTED   Enterobacter cloacae complex NOT DETECTED NOT DETECTED   Escherichia coli DETECTED (A) NOT DETECTED   Klebsiella aerogenes NOT DETECTED NOT DETECTED   Klebsiella oxytoca NOT DETECTED NOT DETECTED   Klebsiella pneumoniae NOT DETECTED NOT DETECTED   Proteus species NOT DETECTED NOT DETECTED   Salmonella species NOT DETECTED NOT DETECTED   Serratia marcescens NOT DETECTED NOT DETECTED   Haemophilus influenzae NOT DETECTED NOT DETECTED   Neisseria meningitidis NOT DETECTED NOT DETECTED   Pseudomonas aeruginosa NOT DETECTED NOT DETECTED   Stenotrophomonas maltophilia NOT DETECTED NOT  DETECTED   Candida albicans NOT DETECTED NOT DETECTED   Candida auris NOT DETECTED NOT DETECTED   Candida glabrata NOT DETECTED NOT DETECTED   Candida krusei NOT DETECTED NOT DETECTED   Candida parapsilosis NOT DETECTED NOT DETECTED   Candida tropicalis NOT DETECTED NOT DETECTED   Cryptococcus neoformans/gattii NOT DETECTED NOT DETECTED   CTX-M ESBL NOT DETECTED NOT DETECTED   Carbapenem resistance IMP NOT DETECTED NOT DETECTED   Carbapenem resistance KPC NOT DETECTED NOT DETECTED   Carbapenem resistance NDM NOT DETECTED NOT DETECTED   Carbapenem resist OXA 48 LIKE NOT DETECTED NOT DETECTED   Carbapenem resistance VIM NOT DETECTED NOT DETECTED   Jeanise Durfey S. Merilynn Finland, PharmD, BCPS Clinical Staff Pharmacist [email or web address] Pasty Spillers 10/01/2022  12:46 PM

## 2022-10-01 NOTE — Progress Notes (Signed)
Mobility Specialist - Progress Note   10/01/22 1621  Mobility  Activity Ambulated independently in hallway  Level of Assistance Independent after set-up  Assistive Device None  Distance Ambulated (ft) 1400 ft  Range of Motion/Exercises Active  Activity Response Tolerated well  Mobility Referral Yes  $Mobility charge 1 Mobility  Mobility Specialist Start Time (ACUTE ONLY) 1600  Mobility Specialist Stop Time (ACUTE ONLY) 1621  Mobility Specialist Time Calculation (min) (ACUTE ONLY) 21 min   Pt was found in room and agreeable to ambulate. Had no complaints during session and at EOS returned to room with all needs met.   Billey Chang Mobility Specialist

## 2022-10-01 NOTE — Progress Notes (Signed)
[facility]  PROGRESS NOTE  Bianca Sanders ZOX:[id number] DOB: [birth year] DOA: 09/30/2022 PCP: Cheron Schaumann., MD   Brief HPI:   57 year old female with medical history of recently diagnosed bipolar disorder and ADHD presented with low back pain.  Patient states that she was treated for UTI approximately 6 weeks ago.  In the ED, CT renal stone study showed moderate right hydronephrosis and perinephric stranding with multiple small calculi in the mid pelvic and distal portion of the right ureter.  Small amount of gas in the right renal collecting system and bladder which may be due to recent instrumentation or gas-forming infection.    Assessment/Plan:    Right hydronephrosis due to right ureteral calculus with obstruction -S/p right ureteral stent placement -Foley catheter in place  UTI -Continue Rocephin -Follow urine culture results  E. coli bacteremia -Blood culture growing E. Coli -Final result pending -Patient on Rocephin as above -Follow cbc in am  Bipolar disorder -Continue home meds  Medications     busPIRone  10 mg Oral BID   cariprazine  1.5 mg Oral Daily   DULoxetine  60 mg Oral Daily   Methylphenidate HCl ER (PM)  100 mg Oral QHS   polyethylene glycol  17 g Oral Daily     Data Reviewed:   CBG:  No results for input(s): "GLUCAP" in the last 168 hours.  SpO2: 98 % O2 Flow Rate (L/min): 10 L/min    Vitals:   10/01/22 0645 10/01/22 0646 10/01/22 0655 10/01/22 1422  BP: 122/69  117/63 123/69  Pulse: 76 79 73 80  Resp: 19 18 20 20   Temp:  98.1 F (36.7 C) 98.7 F (37.1 C) 98.7 F (37.1 C)  TempSrc:   Oral Oral  SpO2: 99% 97% 98% 98%  Weight:      Height:          Data Reviewed:  Basic Metabolic Panel: Recent Labs  Lab 09/30/22 1533 10/01/22 0347  NA 134* 135  K 4.0 4.0  CL 98 101  CO2 27 24  GLUCOSE 95 138*  BUN 21* 19  CREATININE 0.81 0.89  CALCIUM 8.9 8.4*  MG  --  1.9    CBC: Recent Labs  Lab  09/30/22 1533  WBC 23.0*  NEUTROABS 19.1*  HGB 14.5  HCT 42.2  MCV 91.7  PLT 359    LFT Recent Labs  Lab 09/30/22 1533 10/01/22 0347  AST 38 37  ALT 38 38  ALKPHOS 72 63  BILITOT 0.8 0.7  PROT 7.5 6.6  ALBUMIN 4.1 3.4*     Antibiotics: Anti-infectives (From admission, onward)    Start     Dose/Rate Route Frequency Ordered Stop   10/02/22 0500  cefTRIAXone (ROCEPHIN) 2 g in sodium chloride 0.9 % 100 mL IVPB        2 g 200 mL/hr over 30 Minutes Intravenous Every 24 hours 10/01/22 1246     10/01/22 1345  cefTRIAXone (ROCEPHIN) 1 g in sodium chloride 0.9 % 100 mL IVPB        1 g 200 mL/hr over 30 Minutes Intravenous STAT 10/01/22 1246 10/01/22 1746   10/01/22 0500  cefTRIAXone (ROCEPHIN) 1 g in sodium chloride 0.9 % 100 mL IVPB  Status:  Discontinued        1 g 200 mL/hr over 30 Minutes Intravenous Every 24 hours 09/30/22 2348 10/01/22 1246   09/30/22 1700  cefTRIAXone (ROCEPHIN) 1 g in sodium chloride 0.9 % 100 mL IVPB  1 g 200 mL/hr over 30 Minutes Intravenous  Once 09/30/22 1654 09/30/22 1902        DVT prophylaxis: SCDs  Code Status: Full code  Family Communication:    CONSULTS urology   Subjective   Came back from ureteral stent placement.  Denies pain   Objective    Physical Examination:  General-appears in no acute distress Heart-S1-S2, regular, no murmur auscultated Lungs-clear to auscultation bilaterally, no wheezing or crackles auscultated Abdomen-soft, nontender, no organomegaly Extremities-no edema in the lower extremities Neuro-alert, oriented x3, no focal deficit noted  Status is: Inpatient:             Meredeth Ide   [facility] If 7PM-7AM, please contact night-coverage at [email or web address], Office  [phone]   10/01/2022, 5:54 PM  LOS: 1 day

## 2022-10-01 NOTE — Plan of Care (Signed)
  Problem: Education: Goal: Knowledge of General Education information will improve Description: Including pain rating scale, medication(s)/side effects and non-pharmacologic comfort measures 10/01/2022 2247 by Bishop Dublin, RN Outcome: Progressing 10/01/2022 2055 by Bishop Dublin, RN Outcome: Progressing   Problem: Health Behavior/Discharge Planning: Goal: Ability to manage health-related needs will improve 10/01/2022 2247 by Bishop Dublin, RN Outcome: Progressing 10/01/2022 2055 by Bishop Dublin, RN Outcome: Progressing

## 2022-10-01 NOTE — Progress Notes (Signed)
Mobility Specialist - Progress Note   10/01/22 0940  Mobility  Activity Ambulated independently in hallway  Level of Assistance Independent after set-up  Assistive Device None  Distance Ambulated (ft) 50 ft  Range of Motion/Exercises Active  Activity Response Tolerated well  $Mobility charge 1 Mobility  Mobility Specialist Start Time (ACUTE ONLY) 0940  Mobility Specialist Stop Time (ACUTE ONLY) 0950  Mobility Specialist Time Calculation (min) (ACUTE ONLY) 10 min   Pt was found in bed and agreeable to ambulate. Had no complaints during ambulation and returned to room to speak to attending which limited session. Was left with all needs met.   Billey Chang Mobility Specialist

## 2022-10-01 NOTE — Op Note (Signed)
OPERATIVE NOTE   Patient Name: Bianca Sanders  MRN: [id number]   Date of Procedure: 10/01/22  Preoperative diagnosis:  Right ureteral calculus with obstruction UTI   Postoperative diagnosis:  Right ureteral calculus with obstruction  Procedure:  Cystoscopy  Right retrograde pyelogram with intra-operative interpretation Insertion of right ureteral stent (69F x 24 cm, no tether)  Attending: Milderd Meager, MD  Anesthesia: General  Estimated blood loss: None  Fluids: Per anesthesia record  Drains: 69F x 24 cm ureteral stent on right, tether removed; 169F foley catheter  Specimens: Urine from right renal pelvis for culture  Antibiotics: Rocephin IV  Findings: Normal urethra and bladder; dilation of the right ureter extending from the distal ureter approximately into the renal pelvis and calyces; filling defects noted in the right distal ureter.  Indications:  57 year old female presented to the emergency room last night with acute onset of right-sided flank pain.  She had associated nausea and vomiting.  WBC elevated to 23 K.  Urinalysis with many bacteria and nitrite positive.  CT renal stone study showed bilateral renal calculi, moderate right hydronephrosis with multiple small calculi in the mid and distal right ureter with a small amount of gas noted in the right renal collecting system and bladder.  She was admitted to the hospital for IV antibiotics.  She presents now for cystoscopy, right retrograde pyelogram, and insertion of right ureteral stent.  Risk and benefits of the procedure were discussed in detail.  She understands and wishes to proceed with the operation as described.  Description of Procedure:  The patient received IV Rocephin preoperatively.  After successful induction of a general anesthetic, the patient was placed in the dorsolithotomy position.  The patient's genitalia is prepped and draped in sterile fashion.  Under direct visualization, a 21 French  rigid cystoscope was passed through the urethra and into the bladder.  No urethral abnormalities were appreciated.  The bladder was inspected throughout its entirety and no mucosal abnormalities or foreign bodies were noted.  A normal-appearing trigone was seen with a single ureteral orifice bilaterally.  A right retrograde pyelogram was performed for evaluation of the patient's right upper tract given the findings noted on CT imaging.  Scout film showed a nonspecific bowel gas pattern and no obvious bony abnormalities.  Using a 5 [place] open-ended catheter, contrast was gently injected into the right distal ureter.  Some small filling defects were noted in the distal ureter.  There was dilation of the ureter throughout its entirety with some tortuosity noted.  The right renal pelvis and calyces were also dilated.  A sensor wire was passed through the open-ended catheter and into the right renal pelvis.  The open-ended catheter was passed along the guidewire and into the right renal pelvis.  Cloudy purulent urine was noted from the right renal pelvis.  A sample of urine from the right renal pelvis was obtained and sent for culture.  The sensor guidewire was replaced.  A 69F x 24 centimeter double-J ureteral stent was then passed over the guidewire and into the right renal pelvis.  Position was confirmed with fluoroscopy.  The tether was removed prior to stent placement.  There was excellent drainage of the right kidney noted following stent placement.  The cystoscope was removed.  A 16 French Foley catheter was placed with 10 mL of sterile water in the balloon.  The patient was then extubated and taken to the postanesthesia care unit in stable condition.  Complications: None  Condition: Stable, extubated,  transferred to PACU  Plan:  Continue right ureteral stent Foley catheter to promote drainage from the right kidney Continue IV antibiotics Await urine culture results. Will need outpatient follow-up  in approximately 7-10 days for reevaluation and discussion of management of right ureteral calculi.

## 2022-10-01 NOTE — Anesthesia Procedure Notes (Signed)
Procedure Name: LMA Insertion Date/Time: 10/01/2022 5:56 AM  Performed by: Ludwig Lean, CRNAPre-anesthesia Checklist: Patient identified, Emergency Drugs available, Suction available and Patient being monitored Patient Re-evaluated:Patient Re-evaluated prior to induction Oxygen Delivery Method: Circle system utilized Preoxygenation: Pre-oxygenation with 100% oxygen Induction Type: IV induction LMA: LMA inserted LMA Size: 4.0 Number of attempts: 1 Placement Confirmation: positive ETCO2 and breath sounds checked- equal and bilateral Tube secured with: Tape Dental Injury: Teeth and Oropharynx as per pre-operative assessment

## 2022-10-01 NOTE — Anesthesia Postprocedure Evaluation (Signed)
Anesthesia Post Note  Patient: Bianca Sanders  Procedure(s) Performed: CYSTOSCOPY WITH RETROGRADE PYELOGRAM/URETERAL STENT PLACEMENT (Right)     Patient location during evaluation: PACU Anesthesia Type: General Level of consciousness: awake Pain management: pain level controlled Vital Signs Assessment: post-procedure vital signs reviewed and stable Respiratory status: spontaneous breathing, nonlabored ventilation and respiratory function stable Cardiovascular status: blood pressure returned to baseline and stable Postop Assessment: no apparent nausea or vomiting Anesthetic complications: no   No notable events documented.  Last Vitals:  Vitals:   10/01/22 1422 10/01/22 1932  BP: 123/69 128/75  Pulse: 80 90  Resp: 20 17  Temp: 37.1 C 37.1 C  SpO2: 98% 98%    Last Pain:  Vitals:   10/01/22 1932  TempSrc: Oral  PainSc:                  [name]

## 2022-10-01 NOTE — Transfer of Care (Signed)
Immediate Anesthesia Transfer of Care Note  Patient: Virgia Grussing  Procedure(s) Performed: Procedure(s): CYSTOSCOPY WITH RETROGRADE PYELOGRAM/URETERAL STENT PLACEMENT (Right)  Patient Location: PACU  Anesthesia Type:General  Level of Consciousness: Patient easily awoken, sedated, comfortable, cooperative, following commands, responds to stimulation.   Airway & Oxygen Therapy: Patient spontaneously breathing, ventilating well, oxygen via simple oxygen mask.  Post-op Assessment: Report given to PACU RN, vital signs reviewed and stable, moving all extremities.   Post vital signs: Reviewed and stable.  Complications: No apparent anesthesia complications  Last Vitals:  Vitals Value Taken Time  BP 120/73 10/01/22 0634  Temp    Pulse 74 10/01/22 0636  Resp 18 10/01/22 0636  SpO2 100 % 10/01/22 0636  Vitals shown include unvalidated device data.  Last Pain:  Vitals:   10/01/22 0533  TempSrc:   PainSc: 0-No pain         Complications: No notable events documented.

## 2022-10-01 NOTE — Plan of Care (Signed)
  Problem: Education: Goal: Knowledge of General Education information will improve Description Including pain rating scale, medication(s)/side effects and non-pharmacologic comfort measures Outcome: Progressing   Problem: Health Behavior/Discharge Planning: Goal: Ability to manage health-related needs will improve Outcome: Progressing   

## 2022-10-01 NOTE — H&P (Signed)
History and Physical    Patient: Bianca Sanders ZOX:[id number] DOB: 04/06/1965 DOA: 09/30/2022 DOS: the patient was seen and examined on 10/01/2022 PCP: Cheron Schaumann., MD  Patient coming from: Home  Chief Complaint:  Chief Complaint  Patient presents with   Flank Pain   HPI: Bianca Sanders is a 57 y.o. female with medical history significant for recently diagnosed bipolar disorder and ADHD.  No history of kidney stones.  She reports that she woke up this morning in her usual state of health but a few hours later she developed some low back pain.  Within another 2 hours the pain became severe and her husband brought her to the emergency department.  The patient was treated for a UTI approximately 6 weeks ago.  Most of her symptoms had resolved but she was concerned that perhaps all of the infection was not gone.  She denies any fevers or chills and really was feeling fine this morning.  The pain on her right side developed and escalated quickly.  She has 2 brothers who both have trouble with kidney stones.  She also reports that she takes lots of OTC vitamins.   Review of Systems: As mentioned in the history of present illness. All other systems reviewed and are negative. History reviewed. No pertinent past medical history. Past Surgical History:  Procedure Laterality Date   ABDOMINOPLASTY     MASTOPEXY     WISDOM TOOTH EXTRACTION     Social History:  reports that she has been smoking. She has never used smokeless tobacco. She reports current alcohol use of about 4.0 standard drinks of alcohol per week. She reports that she does not use drugs.  Allergies  Allergen Reactions   Neosporin [Bacitracin-Polymyxin B] Other (See Comments)    Blisters   Penicillins Hives   Tramadol Other (See Comments)    "Feeling weird"    Family History  Problem Relation Age of Onset   Hypertension Mother    Thyroid disease Mother        hypothyroid   Atrial fibrillation Mother     Heart failure Father        CHF   Diabetes Father    Cancer Father        bone   Breast cancer Sister 23       estrogen fed   Hypertension Brother    Thyroid disease Maternal Grandfather    Diabetes Paternal Grandfather    Hypertension Brother     Prior to Admission medications   Medication Sig Start Date End Date Taking? Authorizing Provider  Estradiol (DIVIGEL) 1 MG/GM GEL Place onto the skin every morning.    [provider]  progesterone (PROMETRIUM) 100 MG capsule Take 1 capsule by mouth daily. 12/25/13   [provider]  zolpidem (AMBIEN) 5 MG tablet Take 1 tablet by mouth at bedtime. 11/27/13   [provider]    Physical Exam: Vitals:   09/30/22 2000 09/30/22 2023 09/30/22 2206 09/30/22 2257  BP: (!) 172/82   (!) 142/82  Pulse: (!) 48   72  Resp: (!) 24 20  20   Temp:   98 F (36.7 C) 98.2 F (36.8 C)  TempSrc:   Oral Oral  SpO2: 100%   100%  Weight:      Height:       Physical Exam:  General: No acute distress, well developed, well nourished HEENT: Normocephalic, atraumatic, PERRL Cardiovascular: Normal rate and rhythm. Distal pulses intact. Pulmonary: Normal pulmonary effort, normal  breath sounds Gastrointestinal: Nondistended abdomen, soft, hypoactive bowel sounds, no organomegaly, right CVA tenderness and RLQ tenderness Musculoskeletal:Normal ROM, no lower ext edema Lymphadenopathy: No cervical LAD. Skin: Skin is warm and dry. Neuro: No focal deficits noted, AAOx3. PSYCH: Attentive and cooperative  Data Reviewed:  Results for orders placed or performed during the hospital encounter of 09/30/22 (from the past 24 hour(s))  Urinalysis, Routine w reflex microscopic -Urine, Clean Catch     Status: Abnormal   Collection Time: 09/30/22  3:32 PM  Result Value Ref Range   Color, Urine YELLOW YELLOW   APPearance CLEAR CLEAR   Specific Gravity, Urine 1.025 1.005 - 1.030   pH 6.5 5.0 - 8.0   Glucose, UA NEGATIVE NEGATIVE mg/dL   Hgb  urine dipstick NEGATIVE NEGATIVE   Bilirubin Urine NEGATIVE NEGATIVE   Ketones, ur 80 (A) NEGATIVE mg/dL   Protein, ur NEGATIVE NEGATIVE mg/dL   Nitrite POSITIVE (A) NEGATIVE   Leukocytes,Ua NEGATIVE NEGATIVE  Urinalysis, Microscopic (reflex)     Status: Abnormal   Collection Time: 09/30/22  3:32 PM  Result Value Ref Range   RBC / HPF 0-5 0 - 5 RBC/hpf   WBC, UA 0-5 0 - 5 WBC/hpf   Bacteria, UA MANY (A) NONE SEEN   Squamous Epithelial / HPF 0-5 0 - 5 /HPF  CBC with Differential (PNL)     Status: Abnormal   Collection Time: 09/30/22  3:33 PM  Result Value Ref Range   WBC 23.0 (H) 4.0 - 10.5 K/uL   RBC 4.60 3.87 - 5.11 MIL/uL   Hemoglobin 14.5 12.0 - 15.0 g/dL   HCT 40.9 81.1 - 91.4 %   MCV 91.7 80.0 - 100.0 fL   MCH 31.5 26.0 - 34.0 pg   MCHC 34.4 30.0 - 36.0 g/dL   RDW 78.2 95.6 - 21.3 %   Platelets 359 150 - 400 K/uL   nRBC 0.0 0.0 - 0.2 %   Neutrophils Relative % 83 %   Neutro Abs 19.1 (H) 1.7 - 7.7 K/uL   Lymphocytes Relative 13 %   Lymphs Abs 3.0 0.7 - 4.0 K/uL   Monocytes Relative 4 %   Monocytes Absolute 0.9 0.1 - 1.0 K/uL   Eosinophils Relative 0 %   Eosinophils Absolute 0.0 0.0 - 0.5 K/uL   Basophils Relative 0 %   Basophils Absolute 0.0 0.0 - 0.1 K/uL   WBC Morphology TOXIC GRANULATION    RBC Morphology MORPHOLOGY UNREMARKABLE    Smear Review Normal platelet morphology    Immature Granulocytes 0 %   Abs Immature Granulocytes 0.00 0.00 - 0.07 K/uL  Comprehensive metabolic panel     Status: Abnormal   Collection Time: 09/30/22  3:33 PM  Result Value Ref Range   Sodium 134 (L) 135 - 145 mmol/L   Potassium 4.0 3.5 - 5.1 mmol/L   Chloride 98 98 - 111 mmol/L   CO2 27 22 - 32 mmol/L   Glucose, Bld 95 70 - 99 mg/dL   BUN 21 (H) 6 - 20 mg/dL   Creatinine, Ser 0.86 0.44 - 1.00 mg/dL   Calcium 8.9 8.9 - 57.8 mg/dL   Total Protein 7.5 6.5 - 8.1 g/dL   Albumin 4.1 3.5 - 5.0 g/dL   AST 38 15 - 41 U/L   ALT 38 0 - 44 U/L   Alkaline Phosphatase 72 38 - 126 U/L    Total Bilirubin 0.8 0.3 - 1.2 mg/dL   GFR, Estimated >46 >96 mL/min   Anion  gap 9 5 - 15  Lactic acid, plasma     Status: None   Collection Time: 09/30/22  5:27 PM  Result Value Ref Range   Lactic Acid, Venous 1.2 0.5 - 1.9 mmol/L     Assessment and Plan: Right hydronephrosis due to kidney stone/ UTI/Pyelonephritis -  IV Ceftriaxone Urology consulted NPO  2. ADHD/ Bipolar disorder - Resume routine meds tomorrow     Advance Care Planning:   Code Status: Full Code  The patient names her husband as her surrogate decision maker.  CODE STATUS was not discussed. She will be full code for now.  Consults: urology  Family Communication: none  Severity of Illness: The appropriate patient status for this patient is INPATIENT. Inpatient status is judged to be reasonable and necessary in order to provide the required intensity of service to ensure the patient's safety. The patient's presenting symptoms, physical exam findings, and initial radiographic and laboratory data in the context of their chronic comorbidities is felt to place them at high risk for further clinical deterioration. Furthermore, it is not anticipated that the patient will be medically stable for discharge from the hospital within 2 midnights of admission.   * I certify that at the point of admission it is my clinical judgment that the patient will require inpatient hospital care spanning beyond 2 midnights from the point of admission due to high intensity of service, high risk for further deterioration and high frequency of surveillance required.*  Author: Buena Irish, MD 10/01/2022 12:12 AM  For on call review [email or web address].

## 2022-10-01 NOTE — Progress Notes (Signed)
Transition of Care [facility]) - Inpatient Brief Assessment   Patient Details  Name: Bianca Sanders MRN: [id number] Date of Birth: Sep 17, 1965  Transition of Care [facility]) CM/SW Contact:    Larrie Kass, LCSW Phone Number: 10/01/2022, 12:10 PM   Clinical Narrative:  Transition of Care Department [facility]) has reviewed patient and no TOC needs have been identified at this time. We will continue to monitor patient advancement through interdisciplinary progression rounds. If new patient transition needs arise, please place a TOC consult.  Transition of Care Asessment: Insurance and Status: Insurance coverage has been reviewed Patient has primary care physician: Yes Home environment has been reviewed: yes   Prior/Current Home Services: No current home services Social Determinants of Health Reivew: SDOH reviewed no interventions necessary Readmission risk has been reviewed: Yes Transition of care needs: no transition of care needs at this time

## 2022-10-02 ENCOUNTER — Encounter (HOSPITAL_COMMUNITY): Payer: Self-pay | Admitting: Urology

## 2022-10-02 DIAGNOSIS — N132 Hydronephrosis with renal and ureteral calculous obstruction: Secondary | ICD-10-CM | POA: Diagnosis not present

## 2022-10-02 DIAGNOSIS — N390 Urinary tract infection, site not specified: Secondary | ICD-10-CM | POA: Diagnosis not present

## 2022-10-02 LAB — COMPREHENSIVE METABOLIC PANEL
ALT: 29 U/L (ref 0–44)
AST: 22 U/L (ref 15–41)
Albumin: 2.9 g/dL — ABNORMAL LOW (ref 3.5–5.0)
Alkaline Phosphatase: 60 U/L (ref 38–126)
Anion gap: 5 (ref 5–15)
BUN: 22 mg/dL — ABNORMAL HIGH (ref 6–20)
CO2: 28 mmol/L (ref 22–32)
Calcium: 8.2 mg/dL — ABNORMAL LOW (ref 8.9–10.3)
Chloride: 101 mmol/L (ref 98–111)
Creatinine, Ser: 0.82 mg/dL (ref 0.44–1.00)
GFR, Estimated: >60 mL/min (ref >60)
Glucose, Bld: 102 mg/dL — ABNORMAL HIGH (ref 70–99)
Potassium: 4.1 mmol/L (ref 3.5–5.1)
Sodium: 134 mmol/L — ABNORMAL LOW (ref 135–145)
Total Bilirubin: 0.3 mg/dL (ref 0.3–1.2)
Total Protein: 5.9 g/dL — ABNORMAL LOW (ref 6.5–8.1)

## 2022-10-02 LAB — CULTURE, BLOOD (ROUTINE X 2)

## 2022-10-02 LAB — CBC
HCT: 34.3 % — ABNORMAL LOW (ref 36.0–46.0)
Hemoglobin: 11.3 g/dL — ABNORMAL LOW (ref 12.0–15.0)
MCH: 31.5 pg (ref 26.0–34.0)
MCHC: 32.9 g/dL (ref 30.0–36.0)
MCV: 95.5 fL (ref 80.0–100.0)
Platelets: 239 10*3/uL (ref 150–400)
RBC: 3.59 MIL/uL — ABNORMAL LOW (ref 3.87–5.11)
RDW: 14 % (ref 11.5–15.5)
WBC: 22 10*3/uL — ABNORMAL HIGH (ref 4.0–10.5)
nRBC: 0 % (ref 0.0–0.2)

## 2022-10-02 LAB — URINE CULTURE

## 2022-10-02 MED ORDER — CARIPRAZINE HCL 1.5 MG PO CAPS
3.0000 mg | ORAL_CAPSULE | Freq: Every day | ORAL | Status: DC
  Administered 2022-10-02: 3 mg via ORAL
  Filled 2022-10-02: qty 2

## 2022-10-02 NOTE — Progress Notes (Signed)
1 Day Post-Op Subjective: 7/2: Pt resting comfortably in bed. A&O. Discussed stone disease and treatment. All questions were answered to her satisfaction.   Objective: Vital signs in last 24 hours: Temp:  [97.6 F (36.4 C)-98.7 F (37.1 C)] 97.6 F (36.4 C) (07/02 0500) Pulse Rate:  [78-90] 78 (07/02 0500) Resp:  [17-20] 18 (07/02 0500) BP: (123-128)/(69-75) 128/72 (07/02 0500) SpO2:  [98 %-100 %] 100 % (07/02 0500)  Intake/Output from previous day: 07/01 0701 - 07/02 0700 In: 1252.7 [P.O.:360; I.V.:892.7] Out: 3150 [Urine:3150]  Intake/Output this shift: No intake/output data recorded.  Physical Exam:  General: Alert and oriented CV: No cyanosis Lungs: equal chest rise Abdomen: Soft, NTND, no rebound or guarding Gu: foley draining clear yellow urine.   Lab Results: Recent Labs    09/30/22 1533 10/02/22 0406  HGB 14.5 11.3*  HCT 42.2 34.3*   BMET Recent Labs    10/01/22 0347 10/02/22 0406  NA 135 134*  K 4.0 4.1  CL 101 101  CO2 24 28  GLUCOSE 138* 102*  BUN 19 22*  CREATININE 0.89 0.82  CALCIUM 8.4* 8.2*     Studies/Results: DG C-Arm 1-60 Min-No Report  Result Date: 10/01/2022 Fluoroscopy was utilized by the requesting physician.  No radiographic interpretation.   CT RENAL STONE STUDY  Result Date: 09/30/2022 CLINICAL DATA:  Right-sided flank pain for several hours. Nausea and vomiting. EXAM: CT ABDOMEN AND PELVIS WITHOUT CONTRAST TECHNIQUE: Multidetector CT imaging of the abdomen and pelvis was performed following the standard protocol without IV contrast. RADIATION DOSE REDUCTION: This exam was performed according to the departmental dose-optimization program which includes automated exposure control, adjustment of the mA and/or kV according to patient size and/or use of iterative reconstruction technique. COMPARISON:  None Available. FINDINGS: Lower chest: No acute findings. Hepatobiliary: No mass visualized on this unenhanced exam. Gallbladder is  unremarkable. No evidence of biliary ductal dilatation. Pancreas: No mass or inflammatory process visualized on this unenhanced exam. Spleen:  Within normal limits in size. Adrenals/Urinary tract: Several tiny 1-2 mm left renal calculi are seen, however there is no evidence of left-sided hydronephrosis. Right renal swelling is seen with multiple right intrarenal calculi measuring up to 6 mm. Moderate right hydronephrosis and perinephric stranding are seen, with multiple small calculi in the mid pelvic and distal portions of the right ureter. A small amount of gas is seen in the right renal collecting system and bladder, which may be due to recent instrumentation or gas-forming infection. The urinary bladder is empty, and therefore difficult to evaluate. Stomach/Bowel: No evidence of obstruction, inflammatory process, or abnormal fluid collections. Vascular/Lymphatic: No pathologically enlarged lymph nodes identified. No evidence of abdominal aortic aneurysm. Reproductive:  No mass or other significant abnormality. Other:  None. Musculoskeletal: No suspicious bone lesions identified. Left hip prosthesis noted. IMPRESSION: Moderate right hydronephrosis and perinephric stranding, with multiple small calculi in the mid pelvic and distal portions of the right ureter. Small amount of gas in right renal collecting system and bladder, which may be due to recent instrumentation or gas-forming infection. Recommend correlation with urinalysis. Tiny nonobstructing left renal calculi. Electronically Signed   By: Danae Orleans M.D.   On: 09/30/2022 16:45    Assessment/Plan: #right ureteral calculus S/p ureteral stent placement with Dr. Pete Glatter 7/1. Leukocytosis not yet improved. 23-->22k. UC consistent with E.Coli UTI. Susceptibilities to follow. Agree with broad ABX at this time.  TOV this am. Clear yellow urine. Preserved renal function.  Pt to follow up in 7-10  days with Dr. Pete Glatter.    LOS: 2 days   Elmon Kirschner, NP [facility] Pager: [phone]  10/02/2022, 9:24 AM

## 2022-10-02 NOTE — Progress Notes (Signed)
Pt in restroom. Will return for PIV placement

## 2022-10-02 NOTE — Progress Notes (Signed)
Mobility Specialist - Progress Note   10/02/22 1631  Mobility  Activity Ambulated independently in hallway  Level of Assistance Independent  Assistive Device None  Distance Ambulated (ft) 1400 ft  Range of Motion/Exercises Active  Activity Response Tolerated well  Mobility Referral Yes  $Mobility charge 1 Mobility  Mobility Specialist Start Time (ACUTE ONLY) 1615  Mobility Specialist Stop Time (ACUTE ONLY) 1630  Mobility Specialist Time Calculation (min) (ACUTE ONLY) 15 min   Pt was found in bed and agreeable to ambulate. Had no complaints during session and at EOS was left in bathroom. Husband in room.  Billey Chang Mobility Specialist

## 2022-10-02 NOTE — Progress Notes (Signed)
Mobility Specialist - Progress Note   10/02/22 1208  Mobility  Activity Ambulated independently in hallway  Level of Assistance Independent  Assistive Device None  Distance Ambulated (ft) 1400 ft  Range of Motion/Exercises Active  Activity Response Tolerated well  $Mobility charge 1 Mobility  Mobility Specialist Start Time (ACUTE ONLY) 1139  Mobility Specialist Stop Time (ACUTE ONLY) 1208  Mobility Specialist Time Calculation (min) (ACUTE ONLY) 29 min   Pt was found in room and agreeable to ambulate. Had no complaints during session and at EOS returned to room with all needs met.  Billey Chang Mobility Specialist

## 2022-10-02 NOTE — Progress Notes (Signed)
[facility]  PROGRESS NOTE  Adely Miano MWU:[id number] DOB: 15-Mar-1966 DOA: 09/30/2022 PCP: Cheron Schaumann., MD   Brief HPI:   57 year old female with medical history of recently diagnosed bipolar disorder and ADHD presented with low back pain.  Patient states that she was treated for UTI approximately 6 weeks ago.  In the ED, CT renal stone study showed moderate right hydronephrosis and perinephric stranding with multiple small calculi in the mid pelvic and distal portion of the right ureter.  Small amount of gas in the right renal collecting system and bladder which may be due to recent instrumentation or gas-forming infection.    Assessment/Plan:    Right hydronephrosis due to right ureteral calculus with obstruction -S/p right ureteral stent placement -Foley catheter in place  UTI -Continue Rocephin -Follow urine culture results  E. coli bacteremia -Blood culture growing E. Coli -Final result pending -Patient on Rocephin as above -Follow cbc in am  Bipolar disorder -Continue home meds  Medications     busPIRone  10 mg Oral BID   cariprazine  1.5 mg Oral Daily   DULoxetine  60 mg Oral Daily   Methylphenidate HCl ER (PM)  100 mg Oral QHS   polyethylene glycol  17 g Oral Daily     Data Reviewed:   CBG:  No results for input(s): "GLUCAP" in the last 168 hours.  SpO2: 100 % O2 Flow Rate (L/min): 10 L/min    Vitals:   10/01/22 0655 10/01/22 1422 10/01/22 1932 10/02/22 0500  BP: 117/63 123/69 128/75 128/72  Pulse: 73 80 90 78  Resp: 20 20 17 18   Temp: 98.7 F (37.1 C) 98.7 F (37.1 C) 98.7 F (37.1 C) 97.6 F (36.4 C)  TempSrc: Oral Oral Oral Oral  SpO2: 98% 98% 98% 100%  Weight:      Height:          Data Reviewed:  Basic Metabolic Panel: Recent Labs  Lab 09/30/22 1533 10/01/22 0347 10/02/22 0406  NA 134* 135 134*  K 4.0 4.0 4.1  CL 98 101 101  CO2 27 24 28   GLUCOSE 95 138* 102*  BUN 21* 19 22*  CREATININE 0.81 0.89  0.82  CALCIUM 8.9 8.4* 8.2*  MG  --  1.9  --     CBC: Recent Labs  Lab 09/30/22 1533 10/02/22 0406  WBC 23.0* 22.0*  NEUTROABS 19.1*  --   HGB 14.5 11.3*  HCT 42.2 34.3*  MCV 91.7 95.5  PLT 359 239    LFT Recent Labs  Lab 09/30/22 1533 10/01/22 0347 10/02/22 0406  AST 38 37 22  ALT 38 38 29  ALKPHOS 72 63 60  BILITOT 0.8 0.7 0.3  PROT 7.5 6.6 5.9*  ALBUMIN 4.1 3.4* 2.9*     Antibiotics: Anti-infectives (From admission, onward)    Start     Dose/Rate Route Frequency Ordered Stop   10/02/22 0500  cefTRIAXone (ROCEPHIN) 2 g in sodium chloride 0.9 % 100 mL IVPB        2 g 200 mL/hr over 30 Minutes Intravenous Every 24 hours 10/01/22 1246     10/01/22 1345  cefTRIAXone (ROCEPHIN) 1 g in sodium chloride 0.9 % 100 mL IVPB        1 g 200 mL/hr over 30 Minutes Intravenous STAT 10/01/22 1246 10/01/22 1746   10/01/22 0500  cefTRIAXone (ROCEPHIN) 1 g in sodium chloride 0.9 % 100 mL IVPB  Status:  Discontinued        1 g  200 mL/hr over 30 Minutes Intravenous Every 24 hours 09/30/22 2348 10/01/22 1246   09/30/22 1700  cefTRIAXone (ROCEPHIN) 1 g in sodium chloride 0.9 % 100 mL IVPB        1 g 200 mL/hr over 30 Minutes Intravenous  Once 09/30/22 1654 09/30/22 1902        DVT prophylaxis: SCDs  Code Status: Full code  Family Communication:    CONSULTS urology   Subjective    Patient seen and examined, no new complaints.  Objective    Physical Examination:   General-appears in no acute distress Heart-S1-S2, regular, no murmur auscultated Lungs-clear to auscultation bilaterally, no wheezing or crackles auscultated Abdomen-soft, nontender, no organomegaly Extremities-no edema in the lower extremities Neuro-alert, oriented x3, no focal deficit noted  Status is: Inpatient:             Meredeth Ide   [facility] If 7PM-7AM, please contact night-coverage at [email or web address], Office  [phone]   10/02/2022, 9:40 AM  LOS: 2 days

## 2022-10-03 DIAGNOSIS — N390 Urinary tract infection, site not specified: Secondary | ICD-10-CM | POA: Diagnosis not present

## 2022-10-03 DIAGNOSIS — N132 Hydronephrosis with renal and ureteral calculous obstruction: Secondary | ICD-10-CM | POA: Diagnosis not present

## 2022-10-03 LAB — CBC
HCT: 33.2 % — ABNORMAL LOW (ref 36.0–46.0)
Hemoglobin: 10.8 g/dL — ABNORMAL LOW (ref 12.0–15.0)
MCH: 31.3 pg (ref 26.0–34.0)
MCHC: 32.5 g/dL (ref 30.0–36.0)
MCV: 96.2 fL (ref 80.0–100.0)
Platelets: 228 10*3/uL (ref 150–400)
RBC: 3.45 MIL/uL — ABNORMAL LOW (ref 3.87–5.11)
RDW: 13.9 % (ref 11.5–15.5)
WBC: 11.4 10*3/uL — ABNORMAL HIGH (ref 4.0–10.5)
nRBC: 0 % (ref 0.0–0.2)

## 2022-10-03 LAB — URINE CULTURE
Culture: >=100000 — AB
Culture: 60000 — AB

## 2022-10-03 LAB — BASIC METABOLIC PANEL
Anion gap: 5 (ref 5–15)
BUN: 17 mg/dL (ref 6–20)
CO2: 29 mmol/L (ref 22–32)
Calcium: 8.3 mg/dL — ABNORMAL LOW (ref 8.9–10.3)
Chloride: 101 mmol/L (ref 98–111)
Creatinine, Ser: 0.75 mg/dL (ref 0.44–1.00)
GFR, Estimated: >60 mL/min (ref >60)
Glucose, Bld: 110 mg/dL — ABNORMAL HIGH (ref 70–99)
Potassium: 4.2 mmol/L (ref 3.5–5.1)
Sodium: 135 mmol/L (ref 135–145)

## 2022-10-03 LAB — CULTURE, BLOOD (ROUTINE X 2)
Special Requests: ADEQUATE
Special Requests: ADEQUATE

## 2022-10-03 MED ORDER — OXYCODONE HCL 5 MG PO TABS: 5.0000 mg | ORAL_TABLET | Freq: Three times a day (TID) | ORAL | 0 refills | Status: AC | PRN

## 2022-10-03 MED ORDER — DIPHENHYDRAMINE HCL 50 MG/ML IJ SOLN: 25.0000 mg | Freq: Once | INTRAMUSCULAR | Status: DC | PRN

## 2022-10-03 MED ORDER — OXYBUTYNIN CHLORIDE 5 MG PO TABS: 5.0000 mg | ORAL_TABLET | Freq: Three times a day (TID) | ORAL | 0 refills | Status: AC | PRN

## 2022-10-03 MED ORDER — CEFADROXIL 500 MG PO CAPS
500.0000 mg | ORAL_CAPSULE | Freq: Once | ORAL | Status: AC
  Administered 2022-10-03: 500 mg via ORAL
  Filled 2022-10-03: qty 1

## 2022-10-03 MED ORDER — CEFADROXIL 1 G PO TABS: 1.0000 g | ORAL_TABLET | Freq: Two times a day (BID) | ORAL | 0 refills | Status: AC

## 2022-10-03 NOTE — Progress Notes (Signed)
   2 Days Post-Op Subjective: 7/2: Pt resting comfortably in bed. A&O. Discussed stone disease and treatment. All questions were answered to her satisfaction.   Objective: Vital signs in last 24 hours: Temp:  [97.7 F (36.5 C)-98.5 F (36.9 C)] 98.1 F (36.7 C) (07/03 0525) Pulse Rate:  [70-74] 74 (07/03 0525) Resp:  [16-17] 17 (07/03 0525) BP: (99-121)/(63-81) 99/81 (07/03 0525) SpO2:  [99 %-100 %] 100 % (07/03 0525)  Intake/Output from previous day: 07/02 0701 - 07/03 0700 In: 2162.1 [P.O.:600; I.V.:1362.7; IV Piggyback:199.5] Out: 3150 [Urine:3150]  Intake/Output this shift: No intake/output data recorded.  Physical Exam:  General: Alert and oriented CV: No cyanosis Lungs: equal chest rise Abdomen: Soft, NTND, no rebound or guarding Gu: foley draining clear yellow urine.   Lab Results: Recent Labs    09/30/22 1533 10/02/22 0406 10/03/22 0428  HGB 14.5 11.3* 10.8*  HCT 42.2 34.3* 33.2*   BMET Recent Labs    10/02/22 0406 10/03/22 0428  NA 134* 135  K 4.1 4.2  CL 101 101  CO2 28 29  GLUCOSE 102* 110*  BUN 22* 17  CREATININE 0.82 0.75  CALCIUM 8.2* 8.3*     Studies/Results: No results found.  Assessment/Plan: #right ureteral calculus S/p ureteral stent placement with Dr. Pete Glatter 7/1. Leukocytosis nearly normalized. 23-->22-->11.4. UC consistent with E.Coli UTI. Susceptibilities to follow. TOV 7/2. Clear yellow urine. Preserved renal function.  Pt to follow up in 7-10 days with Dr. Pete Glatter.  Ok to discharge from Urology perspective. Will have schedulers contact her. Urology will sign off at this time. Please call with questions or concerns.    LOS: 3 days   Elmon Kirschner, NP [facility] Pager: [phone]  10/03/2022, 8:18 AM

## 2022-10-03 NOTE — Discharge Summary (Signed)
Physician Discharge Summary   Patient: Bianca Sanders MRN: [id number] DOB: [birth year]  Admit date:     09/30/2022  Discharge date: 10/03/22  Discharge Physician: Meredeth Ide   PCP: Cheron Schaumann., MD   Recommendations at discharge:   Follow-up urology as outpatient  Discharge Diagnoses: Principal Problem:   Hydronephrosis with urinary obstruction due to ureteral calculus Active Problems:   Urinary tract infection without hematuria   Hydronephrosis concurrent with and due to calculi of kidney and ureter  Resolved Problems:   * No resolved hospital problems. *  Hospital Course:  57 year old female with medical history of recently diagnosed bipolar disorder and ADHD presented with low back pain.  Patient states that she was treated for UTI approximately 6 weeks ago.  In the ED, CT renal stone study showed moderate right hydronephrosis and perinephric stranding with multiple small calculi in the mid pelvic and distal portion of the right ureter.  Small amount of gas in the right renal collecting system and bladder which may be due to recent instrumentation or gas-forming infection.    Assessment and Plan:  Right hydronephrosis due to right ureteral calculus with obstruction -S/p right ureteral stent placement -Patient to follow-up with urology as outpatient for definitive stone treatment   UTI -Patient was started on IV  Rocephin -Urine culture result obtained showed E. coli sensitive to cefazolin -WBC is down to 11.4 -Will discharge on cefadroxil   E. coli bacteremia -Blood culture growing E. Coli -Final result showed sensitive to cefazolin, pharmacist checked with micro lab -Will discharge on cefadroxil 1 g p.o. twice daily till 10/30/2022   Bipolar disorder -Continue home meds          Consultants: Urology Procedures performed: Ureteral stent placement Disposition: Home Diet recommendation:  Discharge Diet Orders (From admission, onward)     Start      Ordered   10/03/22 0000  Diet - low sodium heart healthy        10/03/22 1501           Regular diet DISCHARGE MEDICATION: Allergies as of 10/03/2022       Reactions   Neosporin [bacitracin-polymyxin B] Other (See Comments)   Blisters   Penicillins Hives   Tramadol Other (See Comments)   "Feeling weird"        Medication List     TAKE these medications    busPIRone 10 MG tablet Commonly known as: BUSPAR Take 10 mg by mouth 2 (two) times daily.   cariprazine 3 MG capsule Commonly known as: VRAYLAR Take 3 mg by mouth daily.   cefadroxil 1 g tablet Commonly known as: DURICEF Take 1 tablet (1 g total) by mouth 2 (two) times daily for 26 days. Start taking on: October 04, 2022   DULoxetine 60 MG capsule Commonly known as: CYMBALTA Take 60 mg by mouth daily.   Jornay PM 100 MG Cp24 Generic drug: Methylphenidate HCl ER (PM) Take 100 mg by mouth at bedtime. Take at 6 pm   oxybutynin 5 MG tablet Commonly known as: DITROPAN Take 1 tablet (5 mg total) by mouth every 8 (eight) hours as needed for bladder spasms.   oxyCODONE 5 MG immediate release tablet Commonly known as: Roxicodone Take 1 tablet (5 mg total) by mouth every 8 (eight) hours as needed.   progesterone 100 MG capsule Commonly known as: PROMETRIUM Take 100 mg by mouth daily.        Follow-up Information     Velazquez, Vira Browns., MD .  Specialty: Internal Medicine Contact information: [address] [place] [address] [phone]         [facility]. Schedule an appointment as soon as possible for a visit .   Why: call tomorrow for follow up in next 2 days Contact information: [address] [place] [address] [phone]               Discharge Exam: Ceasar Mons Weights   09/30/22 1528  Weight: 61.2 kg   General-appears in no acute distress Heart-S1-S2, regular, no murmur auscultated Lungs-clear to auscultation bilaterally, no wheezing or  crackles auscultated Abdomen-soft, nontender, no organomegaly Extremities-no edema in the lower extremities Neuro-alert, oriented x3, no focal deficit noted  Condition at discharge: good  The results of significant diagnostics from this hospitalization (including imaging, microbiology, ancillary and laboratory) are listed below for reference.   Imaging Studies: DG C-Arm 1-60 Min-No Report  Result Date: 10/01/2022 Fluoroscopy was utilized by the requesting physician.  No radiographic interpretation.   CT RENAL STONE STUDY  Result Date: 09/30/2022 CLINICAL DATA:  Right-sided flank pain for several hours. Nausea and vomiting. EXAM: CT ABDOMEN AND PELVIS WITHOUT CONTRAST TECHNIQUE: Multidetector CT imaging of the abdomen and pelvis was performed following the standard protocol without IV contrast. RADIATION DOSE REDUCTION: This exam was performed according to the departmental dose-optimization program which includes automated exposure control, adjustment of the mA and/or kV according to patient size and/or use of iterative reconstruction technique. COMPARISON:  None Available. FINDINGS: Lower chest: No acute findings. Hepatobiliary: No mass visualized on this unenhanced exam. Gallbladder is unremarkable. No evidence of biliary ductal dilatation. Pancreas: No mass or inflammatory process visualized on this unenhanced exam. Spleen:  Within normal limits in size. Adrenals/Urinary tract: Several tiny 1-2 mm left renal calculi are seen, however there is no evidence of left-sided hydronephrosis. Right renal swelling is seen with multiple right intrarenal calculi measuring up to 6 mm. Moderate right hydronephrosis and perinephric stranding are seen, with multiple small calculi in the mid pelvic and distal portions of the right ureter. A small amount of gas is seen in the right renal collecting system and bladder, which may be due to recent instrumentation or gas-forming infection. The urinary bladder is empty,  and therefore difficult to evaluate. Stomach/Bowel: No evidence of obstruction, inflammatory process, or abnormal fluid collections. Vascular/Lymphatic: No pathologically enlarged lymph nodes identified. No evidence of abdominal aortic aneurysm. Reproductive:  No mass or other significant abnormality. Other:  None. Musculoskeletal: No suspicious bone lesions identified. Left hip prosthesis noted. IMPRESSION: Moderate right hydronephrosis and perinephric stranding, with multiple small calculi in the mid pelvic and distal portions of the right ureter. Small amount of gas in right renal collecting system and bladder, which may be due to recent instrumentation or gas-forming infection. Recommend correlation with urinalysis. Tiny nonobstructing left renal calculi. Electronically Signed   By: Danae Orleans M.D.   On: 09/30/2022 16:45    Microbiology: Results for orders placed or performed during the hospital encounter of 09/30/22  Urine Culture     Status: Abnormal   Collection Time: 09/30/22  3:32 PM   Specimen: Urine, Clean Catch  Result Value Ref Range Status   Specimen Description   Final    URINE, CLEAN CATCH Performed at [facility], [address] [place], [place] [address]    Special Requests   Final    NONE Performed at [facility], [address] [place],  [place] [address]    Culture >=100,000 COLONIES/mL ESCHERICHIA COLI (A)  Final   Report Status 10/03/2022 FINAL  Final   Organism ID, Bacteria ESCHERICHIA COLI (A)  Final      Susceptibility   Escherichia coli - MIC*    AMPICILLIN 8 SENSITIVE Sensitive     CEFAZOLIN <=4 SENSITIVE Sensitive     CEFEPIME <=0.12 SENSITIVE Sensitive     CEFTRIAXONE <=0.25 SENSITIVE Sensitive     CIPROFLOXACIN <=0.25 SENSITIVE Sensitive     GENTAMICIN <=1 SENSITIVE Sensitive     IMIPENEM <=0.25 SENSITIVE Sensitive     NITROFURANTOIN <=16 SENSITIVE Sensitive     TRIMETH/SULFA <=20 SENSITIVE Sensitive     AMPICILLIN/SULBACTAM <=2  SENSITIVE Sensitive     PIP/TAZO <=4 SENSITIVE Sensitive     * >=100,000 COLONIES/mL ESCHERICHIA COLI  Blood culture (routine x 2)     Status: Abnormal   Collection Time: 09/30/22  5:22 PM   Specimen: BLOOD  Result Value Ref Range Status   Specimen Description   Final    BLOOD LEFT ANTECUBITAL Performed at [facility], [address] [place], [place] [address]    Special Requests   Final    BOTTLES DRAWN AEROBIC ONLY Blood Culture adequate volume Performed at [facility], [address] [place], [place] [address]    Culture  Setup Time   Final    GRAM NEGATIVE RODS BOTTLES DRAWN AEROBIC ONLY CRITICAL RESULT CALLED TO, READ BACK BY AND VERIFIED WITH: Sophronia Simas [id number] @ 1937 FH    Culture (A)  Final    ESCHERICHIA COLI SUSCEPTIBILITIES PERFORMED ON PREVIOUS CULTURE WITHIN THE LAST 5 DAYS. Performed at [facility], [address], [place], [place] [address]    Report Status 10/03/2022 FINAL  Final  Blood culture (routine x 2)     Status: Abnormal   Collection Time: 09/30/22  5:27 PM   Specimen: BLOOD  Result Value Ref Range Status   Specimen Description   Final    BLOOD RIGHT ANTECUBITAL Performed at [facility], [address] [place], [place] [address]    Special Requests   Final    BOTTLES DRAWN AEROBIC AND ANAEROBIC Blood Culture adequate volume Performed at [facility], [address] [place], [place] [address]    Culture  Setup Time   Final    GRAM NEGATIVE RODS ANAEROBIC BOTTLE ONLY Organism ID to follow CRITICAL RESULT CALLED TO, READ BACK BY AND VERIFIED WITH: PHARMD JUSTIN L 0N [id number] @1239  BY SM Performed at [facility], [address] [place], [place] [address]    Culture ESCHERICHIA COLI (A)  Final   Report Status 10/03/2022 FINAL  Final   Organism ID, Bacteria ESCHERICHIA COLI  Final      Susceptibility   Escherichia coli - MIC*    AMPICILLIN 8 SENSITIVE Sensitive     CEFEPIME <=0.12  SENSITIVE Sensitive     CEFTAZIDIME <=1 SENSITIVE Sensitive     CEFTRIAXONE <=0.25 SENSITIVE Sensitive     CIPROFLOXACIN <=0.25 SENSITIVE Sensitive     GENTAMICIN <=1 SENSITIVE Sensitive     IMIPENEM <=0.25 SENSITIVE Sensitive     TRIMETH/SULFA <=20 SENSITIVE Sensitive     AMPICILLIN/SULBACTAM <=2 SENSITIVE Sensitive     PIP/TAZO <=4 SENSITIVE Sensitive     * ESCHERICHIA COLI  Blood Culture ID Panel (Reflexed)     Status: Abnormal   Collection Time: 09/30/22  5:27 PM  Result Value Ref Range Status   Enterococcus faecalis NOT DETECTED NOT DETECTED Final   Enterococcus Faecium NOT DETECTED NOT DETECTED Final   Listeria monocytogenes NOT DETECTED NOT DETECTED Final   Staphylococcus species NOT DETECTED NOT DETECTED Final   Staphylococcus aureus (BCID) NOT DETECTED NOT DETECTED Final   Staphylococcus epidermidis NOT DETECTED NOT DETECTED Final   Staphylococcus lugdunensis NOT DETECTED NOT DETECTED Final   Streptococcus species NOT DETECTED NOT DETECTED Final   Streptococcus agalactiae NOT DETECTED NOT DETECTED Final   Streptococcus pneumoniae NOT DETECTED NOT DETECTED Final   Streptococcus pyogenes NOT DETECTED NOT DETECTED Final   A.calcoaceticus-baumannii NOT DETECTED NOT DETECTED Final   Bacteroides fragilis NOT DETECTED NOT DETECTED Final   Enterobacterales DETECTED (A) NOT DETECTED Final    Comment: Enterobacterales represent a large order of gram negative bacteria, not a single organism. PHARMD JUSTIN L ON [id number] @1239  BY SM    Enterobacter cloacae complex NOT DETECTED NOT DETECTED Final   Escherichia coli DETECTED (A) NOT DETECTED Final    Comment: PHARMD JUSTIN L ON [id number] @1239  BY SM   Klebsiella aerogenes NOT DETECTED NOT DETECTED Final   Klebsiella oxytoca NOT DETECTED NOT DETECTED Final   Klebsiella pneumoniae NOT DETECTED NOT DETECTED Final   Proteus species NOT DETECTED NOT DETECTED Final   Salmonella species NOT DETECTED NOT DETECTED Final   Serratia marcescens NOT  DETECTED NOT DETECTED Final   Haemophilus influenzae NOT DETECTED NOT DETECTED Final   Neisseria meningitidis NOT DETECTED NOT DETECTED Final   Pseudomonas aeruginosa NOT DETECTED NOT DETECTED Final   Stenotrophomonas maltophilia NOT DETECTED NOT DETECTED Final   Candida albicans NOT DETECTED NOT DETECTED Final   Candida auris NOT DETECTED NOT DETECTED Final   Candida glabrata NOT DETECTED NOT DETECTED Final   Candida krusei NOT DETECTED NOT DETECTED Final   Candida parapsilosis NOT DETECTED NOT DETECTED Final   Candida tropicalis NOT DETECTED NOT DETECTED Final   Cryptococcus neoformans/gattii NOT DETECTED NOT DETECTED Final   CTX-M ESBL NOT DETECTED NOT DETECTED Final   Carbapenem resistance IMP NOT DETECTED NOT DETECTED Final   Carbapenem resistance KPC NOT DETECTED NOT DETECTED Final   Carbapenem resistance NDM NOT DETECTED NOT DETECTED Final   Carbapenem resist OXA 48 LIKE NOT DETECTED NOT DETECTED Final   Carbapenem resistance VIM NOT DETECTED NOT DETECTED Final    Comment: Performed at [facility], [address], [place], [place] [address]  Urine Culture     Status: Abnormal   Collection Time: 10/01/22  6:13 AM   Specimen: Urine, Cystoscope  Result Value Ref Range Status   Specimen Description   Final    URINE, RANDOM Performed at [facility], [address] [place], [place] [address]    Special Requests   Final    CYSTOSCOPE Performed at [facility], [address], [place], [place] [address]    Culture 60,000 COLONIES/mL ESCHERICHIA COLI (A)  Final   Report Status 10/03/2022 FINAL  Final   Organism ID, Bacteria ESCHERICHIA COLI (A)  Final      Susceptibility   Escherichia coli - MIC*    AMPICILLIN 4 SENSITIVE Sensitive     CEFAZOLIN <=4 SENSITIVE Sensitive     CEFEPIME <=0.12 SENSITIVE Sensitive     CEFTRIAXONE <=0.25 SENSITIVE Sensitive     CIPROFLOXACIN <=0.25 SENSITIVE Sensitive     GENTAMICIN <=1 SENSITIVE  Sensitive     IMIPENEM <=0.25 SENSITIVE Sensitive     NITROFURANTOIN <=16  SENSITIVE Sensitive     TRIMETH/SULFA <=20 SENSITIVE Sensitive     AMPICILLIN/SULBACTAM <=2 SENSITIVE Sensitive     PIP/TAZO <=4 SENSITIVE Sensitive     * 60,000 COLONIES/mL ESCHERICHIA COLI    Labs: CBC: Recent Labs  Lab 09/30/22 1533 10/02/22 0406 10/03/22 0428  WBC 23.0* 22.0* 11.4*  NEUTROABS 19.1*  --   --   HGB 14.5 11.3* 10.8*  HCT 42.2 34.3* 33.2*  MCV 91.7 95.5 96.2  PLT 359 239 228   Basic Metabolic Panel: Recent Labs  Lab 09/30/22 1533 10/01/22 0347 10/02/22 0406 10/03/22 0428  NA 134* 135 134* 135  K 4.0 4.0 4.1 4.2  CL 98 101 101 101  CO2 27 24 28 29   GLUCOSE 95 138* 102* 110*  BUN 21* 19 22* 17  CREATININE 0.81 0.89 0.82 0.75  CALCIUM 8.9 8.4* 8.2* 8.3*  MG  --  1.9  --   --    Liver Function Tests: Recent Labs  Lab 09/30/22 1533 10/01/22 0347 10/02/22 0406  AST 38 37 22  ALT 38 38 29  ALKPHOS 72 63 60  BILITOT 0.8 0.7 0.3  PROT 7.5 6.6 5.9*  ALBUMIN 4.1 3.4* 2.9*   CBG: No results for input(s): "GLUCAP" in the last 168 hours.  Discharge time spent: greater than 30 minutes.  Signed: Meredeth Ide, MD [facility] 10/03/2022

## 2022-10-03 NOTE — Progress Notes (Addendum)
Pharmacy: Antimicrobial Stewardship Note  31 YOF with E.coli bacteremia due to urosepsis with a stent placed 10/01/22 planning discharge on oral antibiotics.  Both E.coli in urine/blood show Cefazolin MIC</=4 making Cefadroxil a good option for de-escalation. The patient has a remote PCN allergy in her 20's of hives after receiving a Bicillin shot for strep throat. Fill records indicate she might have received cephalexin previously howevever the patient can not recall if she took this or not.   Plan to give a dose of cefadroxil 500 mg po x 1 inpatient with vital checks q40min for 1 hour, prn benadryl if needed. Plan discussed with MD, patient, and RN. If tolerates can discharge on high dose Cefadroxil 1g po bid to complete her treatment duration. The patient received Rocephin this AM so wouldn't need to start this regimen until 7/4 AM.   Thank you for allowing pharmacy to be a part of this patient's care.  Georgina Pillion, PharmD, BCPS, BCIDP Infectious Diseases Clinical Pharmacist 10/03/2022 10:43 AM   **Pharmacist phone directory can now be found on [email or web address] ([email or web address]).  Listed under [facility].

## 2022-10-11 ENCOUNTER — Encounter: Payer: Self-pay | Admitting: Urology

## 2022-10-11 ENCOUNTER — Ambulatory Visit: Payer: BC Managed Care – PPO | Admitting: Urology

## 2022-10-11 VITALS — BP 161/84 | HR 76 | Ht 63.0 in | Wt 135.0 lb

## 2022-10-11 DIAGNOSIS — Z87440 Personal history of urinary (tract) infections: Secondary | ICD-10-CM

## 2022-10-11 DIAGNOSIS — N201 Calculus of ureter: Secondary | ICD-10-CM | POA: Diagnosis not present

## 2022-10-11 LAB — MICROSCOPIC EXAMINATION
Cast Type: NONE SEEN
Casts: NONE SEEN /lpf
Crystal Type: NONE SEEN
Crystals: NONE SEEN
Trichomonas, UA: NONE SEEN
Yeast, UA: NONE SEEN

## 2022-10-11 LAB — URINALYSIS, ROUTINE W REFLEX MICROSCOPIC
Bilirubin, UA: NEGATIVE
Glucose, UA: NEGATIVE
Ketones, UA: NEGATIVE
Nitrite, UA: NEGATIVE
Specific Gravity, UA: 1.025 (ref 1.005–1.030)
Urobilinogen, Ur: 0.2 mg/dL (ref 0.2–1.0)
pH, UA: 6 (ref 5.0–7.5)

## 2022-10-11 NOTE — Progress Notes (Signed)
Assessment: 1. Ureteral calculus   2. History of UTI     Plan: I reviewed the patient's chart in detail including laboratory results and imaging results. I discussed the options for management of her ureteral calculi including removal of the stent and a trial of spontaneous passage of the ureteral calculi or ureteroscopic management.  Ureteroscopic management would be more definitive.  Risks, benefits, alternatives were discussed with the patient in detail.  Potential risks including, but not limited to, infection; bleeding;  injury to urethra, bladder, or ureter; possible need of other treatments; possible failure to remove the calculus; ureteral  stricture formation; cardiac, pulmonary, cerebrovascular events; and anesthetic complications were discussed.  The patient understands and wishes to proceed. Following our discussion, she is interested in proceeding with cystoscopy, right ureteroscopy, possible laser lithotripsy, stone manipulation, and exchange of right ureteral stent. Complete antibiotics as prescribed  Procedure: The patient will be scheduled for cystoscopy, right ureteroscopy, possible laser lithotripsy, exchange of right ureteral stent at [facility].  Surgical request is placed with the surgery schedulers and will be scheduled at the patient's/family request. Informed consent is given as documented below. Anesthesia: General  The patient does not have sleep apnea, history of MRSA, history of VRE, history of cardiac device requiring special anesthetic needs. Patient is stable and considered clear for surgical in an outpatient ambulatory surgery setting as well as patient hospital setting.  Consent for Operation or Procedure: Provider Certification I hereby certify that the nature, purpose, benefits, usual and most frequent risks of, and alternatives to, the operation or procedure have been explained to the patient (or person authorized to sign for the patient) either by me as  responsible physician or by the provider who is to perform the operation or procedure. Time spent such that the patient/family has had an opportunity to ask questions, and that those questions have been answered. The patient or the patient's representative has been advised that selected tasks may be performed by assistants to the primary health care provider(s). I believe that the patient (or person authorized to sign for the patient) understands what has been explained, and has consented to the operation or procedure. No guarantees were implied or made.   Chief Complaint: Chief Complaint  Patient presents with   Nephrolithiasis    HPI: Bianca Sanders is a 57 y.o. female who presents for continued evaluation of right ureteral calculi and UTI. She presented to the emergency room on 09/30/2022 with acute onset of right-sided flank pain.  She had associated nausea and vomiting.  No fevers or chills.  Urinalysis was nitrite positive with many bacteria.  WBC elevated at 23 K.  CT renal stone study showed right hydronephrosis with multiple right renal calculi, multiple small calculi in the mid and distal right ureter with a small amount of gas noted in the right renal collecting system and bladder.  She was taken to the operating room on 10/01/2022 for cystoscopy, right retrograde pyelogram, and insertion of a right ureteral stent. Urine and blood cultures were positive for E. coli.  She was treated with IV antibiotics and was discharged on oral cefadroxil.  She returns today for follow-up.  She continues on her antibiotic.  She is tolerating her stent very well.  She is not having any flank pain.  No fevers or chills.  No dysuria or gross hematuria.  Portions of the above documentation were copied from a prior visit for review purposes only.  Allergies: Allergies  Allergen Reactions   Neosporin [  Bacitracin-Polymyxin B] Other (See Comments)    Blisters   Penicillins Hives   Tramadol Other (See  Comments)    "Feeling weird"    PMH: No past medical history on file.  PSH: Past Surgical History:  Procedure Laterality Date   ABDOMINOPLASTY     CYSTOSCOPY W/ URETERAL STENT PLACEMENT Right 10/01/2022   Procedure: CYSTOSCOPY WITH RETROGRADE PYELOGRAM/URETERAL STENT PLACEMENT;  Surgeon: Milderd Meager., MD;  Location: [facility];  Service: Urology;  Laterality: Right;   MASTOPEXY     WISDOM TOOTH EXTRACTION      SH: Social History   Tobacco Use   Smoking status: Some Days   Smokeless tobacco: Never  Substance Use Topics   Alcohol use: Yes    Alcohol/week: 4.0 standard drinks of alcohol    Types: 4 drink(s) per week   Drug use: No    ROS: Constitutional:  Negative for fever, chills, weight loss CV: Negative for chest pain, previous MI, hypertension Respiratory:  Negative for shortness of breath, wheezing, sleep apnea, frequent cough GI:  Negative for nausea, vomiting, bloody stool, GERD  PE: BP (!) 161/84   Pulse 76   Ht 5\' 3"  (1.6 m)   Wt 135 lb (61.2 kg)   LMP 12/15/2013   BMI 23.91 kg/m  GENERAL APPEARANCE:  Well appearing, well developed, well nourished, NAD HEENT:  Atraumatic, normocephalic, oropharynx clear NECK:  Supple without lymphadenopathy or thyromegaly ABDOMEN:  Soft, non-tender, no masses EXTREMITIES:  Moves all extremities well, without clubbing, cyanosis, or edema NEUROLOGIC:  Alert and oriented x 3, normal gait, CN II-XII grossly intact MENTAL STATUS:  appropriate BACK:  Non-tender to palpation, No CVAT SKIN:  Warm, dry, and intact   Results: U/A:  11-30 WBC, 11-30 RBC, mod bacteria

## 2022-10-11 NOTE — H&P (View-Only) (Signed)
Assessment: 1. Ureteral calculus   2. History of UTI     Plan: I reviewed the patient's chart in detail including laboratory results and imaging results. I discussed the options for management of her ureteral calculi including removal of the stent and a trial of spontaneous passage of the ureteral calculi or ureteroscopic management.  Ureteroscopic management would be more definitive.  Risks, benefits, alternatives were discussed with the patient in detail.  Potential risks including, but not limited to, infection; bleeding;  injury to urethra, bladder, or ureter; possible need of other treatments; possible failure to remove the calculus; ureteral  stricture formation; cardiac, pulmonary, cerebrovascular events; and anesthetic complications were discussed.  The patient understands and wishes to proceed. Following our discussion, she is interested in proceeding with cystoscopy, right ureteroscopy, possible laser lithotripsy, stone manipulation, and exchange of right ureteral stent. Complete antibiotics as prescribed  Procedure: The patient will be scheduled for cystoscopy, right ureteroscopy, possible laser lithotripsy, exchange of right ureteral stent at [facility].  Surgical request is placed with the surgery schedulers and will be scheduled at the patient's/family request. Informed consent is given as documented below. Anesthesia: General  The patient does not have sleep apnea, history of MRSA, history of VRE, history of cardiac device requiring special anesthetic needs. Patient is stable and considered clear for surgical in an outpatient ambulatory surgery setting as well as patient hospital setting.  Consent for Operation or Procedure: Provider Certification I hereby certify that the nature, purpose, benefits, usual and most frequent risks of, and alternatives to, the operation or procedure have been explained to the patient (or person authorized to sign for the patient) either by me as  responsible physician or by the provider who is to perform the operation or procedure. Time spent such that the patient/family has had an opportunity to ask questions, and that those questions have been answered. The patient or the patient's representative has been advised that selected tasks may be performed by assistants to the primary health care provider(s). I believe that the patient (or person authorized to sign for the patient) understands what has been explained, and has consented to the operation or procedure. No guarantees were implied or made.   Chief Complaint: Chief Complaint  Patient presents with   Nephrolithiasis    HPI: Bianca Sanders is a 57 y.o. female who presents for continued evaluation of right ureteral calculi and UTI. She presented to the emergency room on 09/30/2022 with acute onset of right-sided flank pain.  She had associated nausea and vomiting.  No fevers or chills.  Urinalysis was nitrite positive with many bacteria.  WBC elevated at 23 K.  CT renal stone study showed right hydronephrosis with multiple right renal calculi, multiple small calculi in the mid and distal right ureter with a small amount of gas noted in the right renal collecting system and bladder.  She was taken to the operating room on 10/01/2022 for cystoscopy, right retrograde pyelogram, and insertion of a right ureteral stent. Urine and blood cultures were positive for E. coli.  She was treated with IV antibiotics and was discharged on oral cefadroxil.  She returns today for follow-up.  She continues on her antibiotic.  She is tolerating her stent very well.  She is not having any flank pain.  No fevers or chills.  No dysuria or gross hematuria.  Portions of the above documentation were copied from a prior visit for review purposes only.  Allergies: Allergies  Allergen Reactions   Neosporin [  Bacitracin-Polymyxin B] Other (See Comments)    Blisters   Penicillins Hives   Tramadol Other (See  Comments)    "Feeling weird"    PMH: No past medical history on file.  PSH: Past Surgical History:  Procedure Laterality Date   ABDOMINOPLASTY     CYSTOSCOPY W/ URETERAL STENT PLACEMENT Right 10/01/2022   Procedure: CYSTOSCOPY WITH RETROGRADE PYELOGRAM/URETERAL STENT PLACEMENT;  Surgeon: Milderd Meager., MD;  Location: [facility];  Service: Urology;  Laterality: Right;   MASTOPEXY     WISDOM TOOTH EXTRACTION      SH: Social History   Tobacco Use   Smoking status: Some Days   Smokeless tobacco: Never  Substance Use Topics   Alcohol use: Yes    Alcohol/week: 4.0 standard drinks of alcohol    Types: 4 drink(s) per week   Drug use: No    ROS: Constitutional:  Negative for fever, chills, weight loss CV: Negative for chest pain, previous MI, hypertension Respiratory:  Negative for shortness of breath, wheezing, sleep apnea, frequent cough GI:  Negative for nausea, vomiting, bloody stool, GERD  PE: BP (!) 161/84   Pulse 76   Ht 5\' 3"  (1.6 m)   Wt 135 lb (61.2 kg)   LMP 12/15/2013   BMI 23.91 kg/m  GENERAL APPEARANCE:  Well appearing, well developed, well nourished, NAD HEENT:  Atraumatic, normocephalic, oropharynx clear NECK:  Supple without lymphadenopathy or thyromegaly ABDOMEN:  Soft, non-tender, no masses EXTREMITIES:  Moves all extremities well, without clubbing, cyanosis, or edema NEUROLOGIC:  Alert and oriented x 3, normal gait, CN II-XII grossly intact MENTAL STATUS:  appropriate BACK:  Non-tender to palpation, No CVAT SKIN:  Warm, dry, and intact   Results: U/A:  11-30 WBC, 11-30 RBC, mod bacteria

## 2022-10-12 ENCOUNTER — Encounter (HOSPITAL_BASED_OUTPATIENT_CLINIC_OR_DEPARTMENT_OTHER): Payer: Self-pay | Admitting: Urology

## 2022-10-16 ENCOUNTER — Encounter (HOSPITAL_COMMUNITY): Payer: Self-pay

## 2022-10-17 ENCOUNTER — Telehealth: Payer: Self-pay

## 2022-10-17 NOTE — Telephone Encounter (Signed)
Pt called wanting to know if you can do her procedure any day but Monday. She will need to move patient's around.

## 2022-10-18 ENCOUNTER — Ambulatory Visit: Payer: Self-pay | Admitting: Urology

## 2022-10-18 ENCOUNTER — Other Ambulatory Visit: Payer: Self-pay

## 2022-10-18 ENCOUNTER — Encounter: Payer: BC Managed Care – PPO | Admitting: Urology

## 2022-10-18 ENCOUNTER — Encounter (HOSPITAL_BASED_OUTPATIENT_CLINIC_OR_DEPARTMENT_OTHER): Payer: Self-pay | Admitting: Urology

## 2022-10-18 DIAGNOSIS — N201 Calculus of ureter: Secondary | ICD-10-CM

## 2022-10-18 NOTE — Progress Notes (Signed)
Spoke w/ via phone for pre-op interview---pt Lab needs dos---- none   per anesthesia, surgery orders requested dr Pete Glatter epic ib           Lab results------ cbc, bmp 10-03-2022 epic COVID test -----patient states asymptomatic no test needed Arrive at -------1230 10-22-2022 NPO after MN NO Solid Food.  Clear liquids from MN until---1130 Med rec completed Medications to take morning of surgery -----cefadroxil, buspirone, duloxetine, progesterone Diabetic medication -----n/a Patient instructed no nail polish to be worn day of surgery Patient instructed to bring photo id and insurance card day of surgery Patient aware to have Driver (ride ) / caregiver   husband Bianca Sanders  for 24 hours after surgery  Patient Special Instructions -----none Pre-Op special Instructions -----none Patient verbalized understanding of instructions that were given at this phone interview. Patient denies shortness of breath, chest pain, fever, cough at this phone interview.

## 2022-10-22 ENCOUNTER — Encounter (HOSPITAL_BASED_OUTPATIENT_CLINIC_OR_DEPARTMENT_OTHER)
Admission: RE | Disposition: A | Discharge status: Home / Self Care | Payer: Self-pay | Source: Ambulatory Visit | Attending: Urology

## 2022-10-22 ENCOUNTER — Encounter (HOSPITAL_BASED_OUTPATIENT_CLINIC_OR_DEPARTMENT_OTHER): Payer: Self-pay | Admitting: Urology

## 2022-10-22 ENCOUNTER — Other Ambulatory Visit: Payer: Self-pay

## 2022-10-22 ENCOUNTER — Ambulatory Visit (HOSPITAL_BASED_OUTPATIENT_CLINIC_OR_DEPARTMENT_OTHER): Payer: BC Managed Care – PPO | Admitting: Anesthesiology

## 2022-10-22 ENCOUNTER — Ambulatory Visit (HOSPITAL_COMMUNITY)
Admission: RE | Admit: 2022-10-22 | Discharge: 2022-10-22 | Disposition: A | Discharge status: Home / Self Care | Payer: BC Managed Care – PPO | Source: Ambulatory Visit | Attending: Urology | Admitting: Urology

## 2022-10-22 DIAGNOSIS — N201 Calculus of ureter: Secondary | ICD-10-CM

## 2022-10-22 DIAGNOSIS — N200 Calculus of kidney: Secondary | ICD-10-CM | POA: Diagnosis not present

## 2022-10-22 DIAGNOSIS — N132 Hydronephrosis with renal and ureteral calculous obstruction: Secondary | ICD-10-CM | POA: Insufficient documentation

## 2022-10-22 DIAGNOSIS — Z01818 Encounter for other preprocedural examination: Secondary | ICD-10-CM

## 2022-10-22 DIAGNOSIS — Z87891 Personal history of nicotine dependence: Secondary | ICD-10-CM | POA: Insufficient documentation

## 2022-10-22 DIAGNOSIS — Z87440 Personal history of urinary (tract) infections: Secondary | ICD-10-CM | POA: Insufficient documentation

## 2022-10-22 DIAGNOSIS — M1990 Unspecified osteoarthritis, unspecified site: Secondary | ICD-10-CM | POA: Insufficient documentation

## 2022-10-22 HISTORY — PX: CYSTOSCOPY WITH RETROGRADE PYELOGRAM, URETEROSCOPY AND STENT PLACEMENT: SHX5789

## 2022-10-22 HISTORY — DX: Anxiety disorder, unspecified: F41.9

## 2022-10-22 HISTORY — PX: HOLMIUM LASER APPLICATION: SHX5852

## 2022-10-22 HISTORY — DX: Personal history of urinary calculi: Z87.442

## 2022-10-22 HISTORY — DX: Unspecified osteoarthritis, unspecified site: M19.90

## 2022-10-22 SURGERY — CYSTOURETEROSCOPY, WITH RETROGRADE PYELOGRAM AND STENT INSERTION
Anesthesia: General | Site: Ureter | Laterality: Right

## 2022-10-22 MED ORDER — DEXAMETHASONE SODIUM PHOSPHATE 10 MG/ML IJ SOLN
INTRAMUSCULAR | Status: DC | PRN
  Administered 2022-10-22: 10 mg via INTRAVENOUS

## 2022-10-22 MED ORDER — PHENAZOPYRIDINE HCL 200 MG PO TABS: 200.0000 mg | ORAL_TABLET | Freq: Three times a day (TID) | ORAL | 0 refills | Status: AC | PRN

## 2022-10-22 MED ORDER — IOHEXOL 300 MG/ML  SOLN
INTRAMUSCULAR | Status: DC | PRN
  Administered 2022-10-22: 5 mL via URETHRAL

## 2022-10-22 MED ORDER — ONDANSETRON HCL 4 MG/2ML IJ SOLN
INTRAMUSCULAR | Status: DC | PRN
  Administered 2022-10-22: 4 mg via INTRAVENOUS

## 2022-10-22 MED ORDER — CEFAZOLIN SODIUM-DEXTROSE 2-4 GM/100ML-% IV SOLN
INTRAVENOUS | Status: AC
  Filled 2022-10-22: qty 100

## 2022-10-22 MED ORDER — FENTANYL CITRATE (PF) 100 MCG/2ML IJ SOLN
INTRAMUSCULAR | Status: DC | PRN
  Administered 2022-10-22 (×2): 50 ug via INTRAVENOUS

## 2022-10-22 MED ORDER — ONDANSETRON HCL 4 MG/2ML IJ SOLN
INTRAMUSCULAR | Status: AC
  Filled 2022-10-22: qty 2

## 2022-10-22 MED ORDER — DEXAMETHASONE SODIUM PHOSPHATE 10 MG/ML IJ SOLN
INTRAMUSCULAR | Status: AC
  Filled 2022-10-22: qty 1

## 2022-10-22 MED ORDER — LIDOCAINE HCL (PF) 2 % IJ SOLN
INTRAMUSCULAR | Status: AC
  Filled 2022-10-22: qty 5

## 2022-10-22 MED ORDER — MIDAZOLAM HCL 5 MG/5ML IJ SOLN
INTRAMUSCULAR | Status: DC | PRN
  Administered 2022-10-22: 2 mg via INTRAVENOUS

## 2022-10-22 MED ORDER — 0.9 % SODIUM CHLORIDE (POUR BTL) OPTIME
TOPICAL | Status: DC | PRN
  Administered 2022-10-22: 500 mL

## 2022-10-22 MED ORDER — PROPOFOL 10 MG/ML IV BOLUS
INTRAVENOUS | Status: DC | PRN
Start: 2022-10-22 — End: 2022-10-22
  Administered 2022-10-22: 150 mg via INTRAVENOUS
  Administered 2022-10-22 (×2): 50 mg via INTRAVENOUS

## 2022-10-22 MED ORDER — SODIUM CHLORIDE 0.9 % IR SOLN
Status: DC | PRN
  Administered 2022-10-22: 3000 mL

## 2022-10-22 MED ORDER — LACTATED RINGERS IV SOLN: INTRAVENOUS | Status: DC

## 2022-10-22 MED ORDER — FENTANYL CITRATE (PF) 100 MCG/2ML IJ SOLN: 25.0000 ug | INTRAMUSCULAR | Status: DC | PRN

## 2022-10-22 MED ORDER — LIDOCAINE 2% (20 MG/ML) 5 ML SYRINGE
INTRAMUSCULAR | Status: DC | PRN
  Administered 2022-10-22: 50 mg via INTRAVENOUS

## 2022-10-22 MED ORDER — LIDOCAINE HCL URETHRAL/MUCOSAL 2 % EX GEL
CUTANEOUS | Status: DC | PRN
  Administered 2022-10-22: 1 via URETHRAL

## 2022-10-22 MED ORDER — FLUCONAZOLE 150 MG PO TABS: 150.0000 mg | ORAL_TABLET | Freq: Every day | ORAL | 1 refills | Status: AC

## 2022-10-22 MED ORDER — KETOROLAC TROMETHAMINE 30 MG/ML IJ SOLN
INTRAMUSCULAR | Status: AC
  Filled 2022-10-22: qty 1

## 2022-10-22 MED ORDER — ACETAMINOPHEN 500 MG PO TABS
1000.0000 mg | ORAL_TABLET | Freq: Once | ORAL | Status: AC
  Administered 2022-10-22: 1000 mg via ORAL

## 2022-10-22 MED ORDER — FENTANYL CITRATE (PF) 100 MCG/2ML IJ SOLN
INTRAMUSCULAR | Status: AC
  Filled 2022-10-22: qty 2

## 2022-10-22 MED ORDER — ACETAMINOPHEN 500 MG PO TABS
ORAL_TABLET | ORAL | Status: AC
  Filled 2022-10-22: qty 2

## 2022-10-22 MED ORDER — KETOROLAC TROMETHAMINE 30 MG/ML IJ SOLN
INTRAMUSCULAR | Status: DC | PRN
  Administered 2022-10-22: 30 mg via INTRAVENOUS

## 2022-10-22 MED ORDER — CEFAZOLIN SODIUM-DEXTROSE 2-4 GM/100ML-% IV SOLN
2.0000 g | INTRAVENOUS | Status: AC
  Administered 2022-10-22: 2 g via INTRAVENOUS

## 2022-10-22 MED ORDER — MIDAZOLAM HCL 2 MG/2ML IJ SOLN
INTRAMUSCULAR | Status: AC
  Filled 2022-10-22: qty 2

## 2022-10-22 SURGICAL SUPPLY — 17 items
BAG URO CATCHER STRL LF (MISCELLANEOUS) ×1 IMPLANT
CATH URETL OPEN 5X70 (CATHETERS) IMPLANT
CLOTH BEACON ORANGE TIMEOUT ST (SAFETY) ×1 IMPLANT
GLOVE BIO SURGEON STRL SZ8 (GLOVE) ×1 IMPLANT
GLOVE SURG SS PI 7.5 STRL IVOR (GLOVE) IMPLANT
GOWN STRL REUS W/TWL LRG LVL3 (GOWN DISPOSABLE) IMPLANT
GOWN STRL REUS W/TWL XL LVL3 (GOWN DISPOSABLE) ×1 IMPLANT
GUIDEWIRE STR DUAL SENSOR (WIRE) ×1 IMPLANT
IV NS IRRIG 3000ML ARTHROMATIC (IV SOLUTION) ×2 IMPLANT
KIT TURNOVER CYSTO (KITS) ×1 IMPLANT
MANIFOLD NEPTUNE II (INSTRUMENTS) ×1 IMPLANT
NS IRRIG 500ML POUR BTL (IV SOLUTION) IMPLANT
PACK CYSTO (CUSTOM PROCEDURE TRAY) ×1 IMPLANT
SLEEVE SCD COMPRESS KNEE MED (STOCKING) ×1 IMPLANT
STENT URET 6FRX24 CONTOUR (STENTS) IMPLANT
TUBING UROLOGY SET (TUBING) ×1 IMPLANT
WATER STERILE IRR 500ML POUR (IV SOLUTION) ×1 IMPLANT

## 2022-10-22 NOTE — Interval H&P Note (Signed)
History and Physical Interval Note:  10/22/2022 1:51 PM  Bianca Sanders  has presented today for surgery, with the diagnosis of ureteral calculus.  The various methods of treatment have been discussed with the patient and family. After consideration of risks, benefits and other options for treatment, the patient has consented to  Procedure(s): CYSTOSCOPY WITH RETROGRADE PYELOGRAM, URETEROSCOPY AND STENT PLACEMENT (Right) as a surgical intervention.  The patient's history has been reviewed, patient examined, no change in status, stable for surgery.  I have reviewed the patient's chart and labs.  Questions were answered to the patient's satisfaction.     Di Kindle

## 2022-10-22 NOTE — Discharge Instructions (Signed)
  Post Anesthesia Home Care Instructions  Activity: Get plenty of rest for the remainder of the day. A responsible individual must stay with you for 24 hours following the procedure.  For the next 24 hours, DO NOT: -Drive a car -Advertising copywriter -Drink alcoholic beverages -Take any medication unless instructed by your physician -Make any legal decisions or sign important papers.  Meals: Start with liquid foods such as gelatin or soup. Progress to regular foods as tolerated. Avoid greasy, spicy, heavy foods. If nausea and/or vomiting occur, drink only clear liquids until the nausea and/or vomiting subsides. Call your physician if vomiting continues.  Special Instructions/Symptoms: Your throat may feel dry or sore from the anesthesia or the breathing tube placed in your throat during surgery. If this causes discomfort, gargle with warm salt water. The discomfort should disappear within 24 hours.  If you had a scopolamine patch placed behind your ear for the management of post- operative nausea and/or vomiting:  1. The medication in the patch is effective for 72 hours, after which it should be removed.  Wrap patch in a tissue and discard in the trash. Wash hands thoroughly with soap and water. 2. You may remove the patch earlier than 72 hours if you experience unpleasant side effects which may include dry mouth, dizziness or visual disturbances. 3. Avoid touching the patch. Wash your hands with soap and water after contact with the patch.    No acetaminophen/Tylenol until after 7 pm today if needed. No ibuprofen, Advil, Aleve, Motrin, ketorolac, meloxicam, naproxen, or other NSAIDS until after 8:30 pm today if needed.

## 2022-10-22 NOTE — Op Note (Signed)
OPERATIVE NOTE   Patient Name: Bianca Sanders  MRN: [id number]   Date of Procedure: 10/22/22  Preoperative diagnosis:  Right ureteral calculus Right renal calculus  Postoperative diagnosis:  Right ureteral calculus -s/p spontaneous passage Right renal calculus  Procedure:  Cystoscopy Right retrograde pyelogram Right ureteroscopy Holmium laser lithotripsy of right renal calculus Exchange of right ureteral stent (11F x 24 cm, with tether)  Attending: Milderd Meager, MD  Anesthesia: General  Estimated blood loss: None  Fluids: Per anesthesia record  Drains: 11F x 24 cm right ureteral stent, with tether  Specimens: None  Antibiotics: Ancef 2 gm IV  Findings: No right ureteral calculi seen; soft, matrix type stone seen in the right lower pole calyx, 6 mm in size  Indications:  57 year old female presents for surgical management of right ureteral calculi and a right renal calculus.  She presented to the emergency room on 09/30/2022 with acute onset of right-sided flank pain.  Her urine was nitrite positive with many bacteria.  Her white count was elevated at 23 K.  CT renal stone study showed right hydronephrosis with a 6 mm right renal lower pole calculus, several smaller renal calculi, multiple small calculi in the mid and distal right ureter and a small amount of gas noted in the right renal collecting system and bladder.  She was taken to the operating room on 10/01/2022 where she underwent cystoscopy, right retrograde pyelogram and insertion of a right ureteral stent.  Urine and blood cultures were ultimately positive for E. coli.  She was treated with IV antibiotics and was discharged on oral cefadroxil.  She has tolerated her stent well.  She presents now for definitive management of the right ureteral calculi and possibly the right renal calculus.  The procedure including potential risk been discussed with the patient in detail.  She understands and wishes to proceed  with the operation as described.  Description of Procedure:  The patient received IV Ancef preoperatively.  After successful induction of a general anesthetic, the patient was placed in the dorsolithotomy position.  The patient's genital area was prepped and draped in sterile fashion.  Under direct visualization, a 21 French rigid cystoscope was passed through the urethra into the bladder.  A right ureteral stent was seen emanating from the right ureteral orifice.  The remainder of the bladder was normal in appearance.  No stones were seen within the bladder.  Using grasping forceps, the distal aspect of the stent was brought to the urethral meatus.  A sensor guidewire was then passed through the stent and into the right renal pelvis under fluoroscopic guidance.  A right retrograde pyelogram was performed for evaluation of the patient's right upper tract.  Scout film showed the guide wire in good position curled in the right renal pelvis, no obvious calcifications were seen along the course of the stent or in the area of the right kidney.  A nonspecific bowel gas pattern was noted.  A left hip prosthesis was noted.  A 5 French open-ended catheter was passed over the guidewire.  Contrast was injected through the open-ended catheter and filled a normal-appearing right renal pelvis and calyces.  No definite filling defects were noted.  The sensor guidewire was then replaced through the open-ended catheter.  Ureteroscopy was performed alongside the guidewire using the semirigid ureteroscope.  No stones were seen within the ureter with inspection up to the renal pelvis.  A second sensor guidewire was then placed through the ureteroscope and into the right  renal pelvis.  A flexible ureteroscope was then passed over the second guidewire and into the right renal pelvis.  Flexible ureteroscopy was performed.  The renal pelvis and all the calyces were inspected.  A  6 mm calculus was noted in the lower pole calyx.   Several smaller calculi were noted adjacent to this stone.  The stone appeared to be soft.  Using the holmium laser fiber on a dusting setting, the stone was fragmented into numerous tiny fragments.  No large fragments remained.  A number of these fragments were irrigated out of the lower pole calyx with the ureteroscope.  Given the small size of the fragments, I did not think that retrieval was necessary.  The renal pelvis and calyces were again inspected.  No other calculi requiring fragmentation were seen.  The ureteroscope was removed.  The ureter was noted to be free of any injury.  A 6 French by 24 cm double-J stent was then passed over the remaining guidewire and into the right renal pelvis under cystoscopic and fluoroscopic guidance.  Position was confirmed and the guidewire was removed.  The tether was left attached to the stent and brought through the meatus.  The bladder was then drained and the cystoscope was removed.  Intraurethral lidocaine jelly was placed.  The patient was then extubated and taken to the post anesthesia care unit in stable condition.  Complications: None  Condition: Stable, extubated, transferred to PACU  Plan:  D/C Home Plan on stent removal later this week.

## 2022-10-22 NOTE — Transfer of Care (Signed)
Immediate Anesthesia Transfer of Care Note  Patient: Bianca Sanders  Procedure(s) Performed: CYSTOSCOPY WITH RETROGRADE PYELOGRAM, URETEROSCOPY AND STENT EXCHANGE (Right: Ureter) HOLMIUM LASER APPLICATION (Right: Ureter)  Patient Location: PACU  Anesthesia Type:General  Level of Consciousness: awake, alert , and oriented  Airway & Oxygen Therapy: Patient Spontanous Breathing and Patient connected to nasal cannula oxygen  Post-op Assessment: Report given to RN  Post vital signs: Reviewed and stable  Last Vitals:  Vitals Value Taken Time  BP 161/81 10/22/22 1518  Temp    Pulse 62 10/22/22 1518  Resp 12 10/22/22 1519  SpO2 100 % 10/22/22 1518  Vitals shown include unfiled device data.  Last Pain:  Vitals:   10/22/22 1249  TempSrc: Oral  PainSc: 0-No pain      Patients Stated Pain Goal: 5 (10/22/22 1249)  Complications: No notable events documented.

## 2022-10-22 NOTE — Anesthesia Procedure Notes (Signed)
Procedure Name: LMA Insertion Date/Time: 10/22/2022 2:18 PM  Performed by: Briant Sites, CRNAPre-anesthesia Checklist: Patient identified, Emergency Drugs available, Suction available and Patient being monitored Patient Re-evaluated:Patient Re-evaluated prior to induction Oxygen Delivery Method: Circle system utilized Preoxygenation: Pre-oxygenation with 100% oxygen Induction Type: IV induction Ventilation: Mask ventilation without difficulty LMA: LMA inserted LMA Size: 4.0 Number of attempts: 1 Airway Equipment and Method: Bite block Placement Confirmation: positive ETCO2 Tube secured with: Tape Dental Injury: Teeth and Oropharynx as per pre-operative assessment

## 2022-10-22 NOTE — Anesthesia Preprocedure Evaluation (Addendum)
Anesthesia Evaluation  Patient identified by MRN, date of birth, ID band Patient awake    Reviewed: Allergy & Precautions, H&P , NPO status , Patient's Chart, lab work & pertinent test results  Airway Mallampati: II  TM Distance: >3 FB Neck ROM: Full    Dental no notable dental hx. (+) Teeth Intact, Dental Advisory Given   Pulmonary former smoker   Pulmonary exam normal breath sounds clear to auscultation       Cardiovascular negative cardio ROS  Rhythm:Regular Rate:Normal     Neuro/Psych   Anxiety     negative neurological ROS     GI/Hepatic negative GI ROS, Neg liver ROS,,,  Endo/Other  negative endocrine ROS    Renal/GU Renal disease  negative genitourinary   Musculoskeletal  (+) Arthritis , Osteoarthritis,    Abdominal   Peds  Hematology negative hematology ROS (+)   Anesthesia Other Findings   Reproductive/Obstetrics negative OB ROS                             Anesthesia Physical Anesthesia Plan  ASA: 2  Anesthesia Plan: General   Post-op Pain Management: Tylenol PO (pre-op)*   Induction: Intravenous  PONV Risk Score and Plan: 4 or greater and Ondansetron, Dexamethasone, Propofol infusion and TIVA  Airway Management Planned: LMA  Additional Equipment:   Intra-op Plan:   Post-operative Plan: Extubation in OR  Informed Consent: I have reviewed the patients History and Physical, chart, labs and discussed the procedure including the risks, benefits and alternatives for the proposed anesthesia with the patient or authorized representative who has indicated his/her understanding and acceptance.     Dental advisory given  Plan Discussed with: CRNA  Anesthesia Plan Comments:        Anesthesia Quick Evaluation

## 2022-10-23 ENCOUNTER — Encounter (HOSPITAL_BASED_OUTPATIENT_CLINIC_OR_DEPARTMENT_OTHER): Payer: Self-pay | Admitting: Urology

## 2022-10-23 NOTE — Anesthesia Postprocedure Evaluation (Signed)
Anesthesia Post Note  Patient: Bianca Sanders  Procedure(s) Performed: CYSTOSCOPY WITH RETROGRADE PYELOGRAM, URETEROSCOPY AND STENT EXCHANGE (Right: Ureter) HOLMIUM LASER APPLICATION (Right: Ureter)     Patient location during evaluation: PACU Anesthesia Type: General Level of consciousness: awake and alert Pain management: pain level controlled Vital Signs Assessment: post-procedure vital signs reviewed and stable Respiratory status: spontaneous breathing, nonlabored ventilation and respiratory function stable Cardiovascular status: blood pressure returned to baseline and stable Postop Assessment: no apparent nausea or vomiting Anesthetic complications: no  No notable events documented.  Last Vitals:  Vitals:   10/22/22 1545 10/22/22 1615  BP: (!) 146/80 (!) 140/80  Pulse: (!) 46 (!) 51  Resp: 12 14  Temp:  36.4 C  SpO2: 98% 100%    Last Pain:  Vitals:   10/22/22 1615  TempSrc:   PainSc: 0-No pain                 Orman Matsumura,W. EDMOND

## 2022-10-25 ENCOUNTER — Ambulatory Visit (INDEPENDENT_AMBULATORY_CARE_PROVIDER_SITE_OTHER): Payer: BC Managed Care – PPO | Admitting: Urology

## 2022-10-25 ENCOUNTER — Encounter: Payer: Self-pay | Admitting: Urology

## 2022-10-25 VITALS — BP 139/82 | HR 56 | Ht 63.0 in | Wt 140.0 lb

## 2022-10-25 DIAGNOSIS — N200 Calculus of kidney: Secondary | ICD-10-CM

## 2022-10-25 DIAGNOSIS — Z87442 Personal history of urinary calculi: Secondary | ICD-10-CM

## 2022-10-25 DIAGNOSIS — N201 Calculus of ureter: Secondary | ICD-10-CM

## 2022-10-25 DIAGNOSIS — Z87440 Personal history of urinary (tract) infections: Secondary | ICD-10-CM

## 2022-10-25 DIAGNOSIS — Z09 Encounter for follow-up examination after completed treatment for conditions other than malignant neoplasm: Secondary | ICD-10-CM

## 2022-10-25 LAB — MICROSCOPIC EXAMINATION

## 2022-10-25 LAB — URINALYSIS, ROUTINE W REFLEX MICROSCOPIC
Bilirubin, UA: NEGATIVE
Ketones, UA: NEGATIVE
Nitrite, UA: POSITIVE — AB
Specific Gravity, UA: 1.015 (ref 1.005–1.030)
Urobilinogen, Ur: 0.2 mg/dL (ref 0.2–1.0)
pH, UA: 7 (ref 5.0–7.5)

## 2022-10-25 NOTE — Progress Notes (Signed)
Assessment: 1. Ureteral calculus   2. History of UTI   3. Nephrolithiasis     Plan: Stent removed today with tether Complete antibiotics Return to office in 1 month  Chief Complaint: Chief Complaint  Patient presents with   Ureteral calculus    HPI: Bianca Sanders is a 57 y.o. female who presents for continued evaluation of right ureteral calculi and UTI. She presented to the emergency room on 09/30/2022 with acute onset of right-sided flank pain.  She had associated nausea and vomiting.  No fevers or chills.  Urinalysis was nitrite positive with many bacteria.  WBC elevated at 23 K.  CT renal stone study showed right hydronephrosis with multiple right renal calculi, multiple small calculi in the mid and distal right ureter with a small amount of gas noted in the right renal collecting system and bladder.  She was taken to the operating room on 10/01/2022 for cystoscopy, right retrograde pyelogram, and insertion of a right ureteral stent. Urine and blood cultures were positive for E. coli.  She was treated with IV antibiotics and was discharged on oral cefadroxil.  She is s/p right ureteroscopy with laser lithotripsy of renal calculus on 10/22/22.   She presents today for stent removal. She continues on her antibiotics.  She is having some stent related symptoms.  No flank pain.  No fevers or chills.  No gross hematuria.   Portions of the above documentation were copied from a prior visit for review purposes only.  Allergies: Allergies  Allergen Reactions   Neosporin [Bacitracin-Polymyxin B] Other (See Comments)    Blisters   Penicillins Hives    Hives with inhections, can oral oral pencillian   Tramadol Other (See Comments)    "Feeling weird"    PMH: Past Medical History:  Diagnosis Date   Anxiety    Arthritis    History of kidney stones     PSH: Past Surgical History:  Procedure Laterality Date   ABDOMINOPLASTY     yrs afo   CYSTOSCOPY W/ URETERAL STENT  PLACEMENT Right 10/01/2022   Procedure: CYSTOSCOPY WITH RETROGRADE PYELOGRAM/URETERAL STENT PLACEMENT;  Surgeon: Milderd Meager., MD;  Location: [facility];  Service: Urology;  Laterality: Right;   CYSTOSCOPY WITH RETROGRADE PYELOGRAM, URETEROSCOPY AND STENT PLACEMENT Right 10/22/2022   Procedure: CYSTOSCOPY WITH RETROGRADE PYELOGRAM, URETEROSCOPY AND STENT EXCHANGE;  Surgeon: Milderd Meager., MD;  Location: [facility];  Service: Urology;  Laterality: Right;   HOLMIUM LASER APPLICATION Right 10/22/2022   Procedure: HOLMIUM LASER APPLICATION;  Surgeon: Milderd Meager., MD;  Location: [facility];  Service: Urology;  Laterality: Right;   implants removed from breast  2021   MASTOPEXY     (breast lift)   WISDOM TOOTH EXTRACTION      SH: Social History   Tobacco Use   Smoking status: Former    Types: Cigarettes   Smokeless tobacco: Never  Vaping Use   Vaping status: Never Used  Substance Use Topics   Alcohol use: Not Currently   Drug use: No    ROS: Constitutional:  Negative for fever, chills, weight loss CV: Negative for chest pain, previous MI, hypertension Respiratory:  Negative for shortness of breath, wheezing, sleep apnea, frequent cough GI:  Negative for nausea, vomiting, bloody stool, GERD  PE: BP 139/82   Pulse (!) 56   Ht 5\' 3"  (1.6 m)   Wt 140 lb (63.5 kg)   LMP 12/15/2013   BMI 24.80 kg/m  GENERAL  APPEARANCE:  Well appearing, well developed, well nourished, NAD HEENT:  Atraumatic, normocephalic, oropharynx clear NECK:  Supple without lymphadenopathy or thyromegaly ABDOMEN:  Soft, non-tender, no masses EXTREMITIES:  Moves all extremities well, without clubbing, cyanosis, or edema NEUROLOGIC:  Alert and oriented x 3, normal gait, CN II-XII grossly intact MENTAL STATUS:  appropriate BACK:  Non-tender to palpation, No CVAT SKIN:  Warm, dry, and intact   Results: U/A:  6-10 WBC, 3-10 RBC

## 2022-11-27 ENCOUNTER — Ambulatory Visit (INDEPENDENT_AMBULATORY_CARE_PROVIDER_SITE_OTHER): Payer: BC Managed Care – PPO | Admitting: Urology

## 2022-11-27 ENCOUNTER — Telehealth (HOSPITAL_BASED_OUTPATIENT_CLINIC_OR_DEPARTMENT_OTHER): Payer: Self-pay

## 2022-11-27 ENCOUNTER — Encounter: Payer: Self-pay | Admitting: Urology

## 2022-11-27 VITALS — BP 127/62 | HR 61 | Ht 63.0 in | Wt 137.0 lb

## 2022-11-27 DIAGNOSIS — N201 Calculus of ureter: Secondary | ICD-10-CM

## 2022-11-27 DIAGNOSIS — Z87442 Personal history of urinary calculi: Secondary | ICD-10-CM

## 2022-11-27 DIAGNOSIS — Z09 Encounter for follow-up examination after completed treatment for conditions other than malignant neoplasm: Secondary | ICD-10-CM | POA: Diagnosis not present

## 2022-11-27 DIAGNOSIS — N200 Calculus of kidney: Secondary | ICD-10-CM

## 2022-11-27 DIAGNOSIS — Z87440 Personal history of urinary (tract) infections: Secondary | ICD-10-CM | POA: Diagnosis not present

## 2022-11-27 NOTE — Progress Notes (Signed)
Assessment: 1. Ureteral calculus   2. Nephrolithiasis   3. History of UTI     Plan: Stone prevention discussed and information provided. Schedule for renal ultrasound in the next 3-4 weeks. Return to office as needed.  Chief Complaint: Chief Complaint  Patient presents with   Nephrolithiasis    HPI: Bianca Sanders is a 57 y.o. female who presents for continued evaluation of right ureteral calculi and UTI. She presented to the emergency room on 09/30/2022 with acute onset of right-sided flank pain.  She had associated nausea and vomiting.  No fevers or chills.  Urinalysis was nitrite positive with many bacteria.  WBC elevated at 23 K.  CT renal stone study showed right hydronephrosis with multiple right renal calculi, multiple small calculi in the mid and distal right ureter with a small amount of gas noted in the right renal collecting system and bladder.  She was taken to the operating room on 10/01/2022 for cystoscopy, right retrograde pyelogram, and insertion of a right ureteral stent. Urine and blood cultures were positive for E. coli.  She was treated with IV antibiotics and was discharged on oral cefadroxil.  She is s/p right ureteroscopy with laser lithotripsy of renal calculus on 10/22/22.   Her right ureteral stent was removed on 10/25/22.  She returns today for scheduled follow-up.  She has done very well since the stent was removed.  No flank pain.  No dysuria or gross hematuria.   Portions of the above documentation were copied from a prior visit for review purposes only.  Allergies: Allergies  Allergen Reactions   Neosporin [Bacitracin-Polymyxin B] Other (See Comments)    Blisters   Penicillins Hives    Hives with inhections, can oral oral pencillian   Tramadol Other (See Comments)    "Feeling weird"    PMH: Past Medical History:  Diagnosis Date   Anxiety    Arthritis    History of kidney stones     PSH: Past Surgical History:  Procedure Laterality  Date   ABDOMINOPLASTY     yrs afo   CYSTOSCOPY W/ URETERAL STENT PLACEMENT Right 10/01/2022   Procedure: CYSTOSCOPY WITH RETROGRADE PYELOGRAM/URETERAL STENT PLACEMENT;  Surgeon: Milderd Meager., MD;  Location: [facility];  Service: Urology;  Laterality: Right;   CYSTOSCOPY WITH RETROGRADE PYELOGRAM, URETEROSCOPY AND STENT PLACEMENT Right 10/22/2022   Procedure: CYSTOSCOPY WITH RETROGRADE PYELOGRAM, URETEROSCOPY AND STENT EXCHANGE;  Surgeon: Milderd Meager., MD;  Location: [facility];  Service: Urology;  Laterality: Right;   HOLMIUM LASER APPLICATION Right 10/22/2022   Procedure: HOLMIUM LASER APPLICATION;  Surgeon: Milderd Meager., MD;  Location: [facility];  Service: Urology;  Laterality: Right;   implants removed from breast  2021   MASTOPEXY     (breast lift)   WISDOM TOOTH EXTRACTION      SH: Social History   Tobacco Use   Smoking status: Former    Types: Cigarettes   Smokeless tobacco: Never  Vaping Use   Vaping status: Never Used  Substance Use Topics   Alcohol use: Not Currently   Drug use: No    ROS: Constitutional:  Negative for fever, chills, weight loss CV: Negative for chest pain, previous MI, hypertension Respiratory:  Negative for shortness of breath, wheezing, sleep apnea, frequent cough GI:  Negative for nausea, vomiting, bloody stool, GERD  PE: BP 127/62   Pulse 61   Ht 5\' 3"  (1.6 m)   Wt 137 lb (62.1 kg)  LMP 12/15/2013   BMI 24.27 kg/m  GENERAL APPEARANCE:  Well appearing, well developed, well nourished, NAD HEENT:  Atraumatic, normocephalic, oropharynx clear NECK:  Supple without lymphadenopathy or thyromegaly ABDOMEN:  Soft, non-tender, no masses EXTREMITIES:  Moves all extremities well, without clubbing, cyanosis, or edema NEUROLOGIC:  Alert and oriented x 3, normal gait, CN II-XII grossly intact MENTAL STATUS:  appropriate BACK:  Non-tender to palpation, No CVAT SKIN:  Warm, dry, and  intact   Results: U/A: Negative

## 2022-12-05 LAB — URINALYSIS, ROUTINE W REFLEX MICROSCOPIC
Bilirubin, UA: NEGATIVE
Glucose, UA: NEGATIVE
Ketones, UA: NEGATIVE
Leukocytes,UA: NEGATIVE
Nitrite, UA: NEGATIVE
Protein,UA: NEGATIVE
Specific Gravity, UA: 1.02 (ref 1.005–1.030)
Urobilinogen, Ur: 0.2 mg/dL (ref 0.2–1.0)
pH, UA: 7 (ref 5.0–7.5)

## 2022-12-25 ENCOUNTER — Ambulatory Visit (HOSPITAL_BASED_OUTPATIENT_CLINIC_OR_DEPARTMENT_OTHER)
Admission: RE | Admit: 2022-12-25 | Discharge: 2022-12-25 | Disposition: A | Discharge status: Home / Self Care | Payer: BC Managed Care – PPO | Source: Ambulatory Visit | Attending: Urology | Admitting: Urology

## 2022-12-25 DIAGNOSIS — N200 Calculus of kidney: Secondary | ICD-10-CM | POA: Insufficient documentation

## 2022-12-25 DIAGNOSIS — N201 Calculus of ureter: Secondary | ICD-10-CM | POA: Diagnosis present

## 2023-01-14 ENCOUNTER — Encounter: Payer: Self-pay | Admitting: Urology
